# Patient Record
Sex: Female | Born: 1965 | Hispanic: Yes | Marital: Married | Smoking: Never smoker
Health system: Western US, Academic
[De-identification: ages and names within clinical notes are randomized; demographics above are authoritative.]

## PROBLEM LIST (undated history)

## (undated) ENCOUNTER — Ambulatory Visit (HOSPITAL_BASED_OUTPATIENT_CLINIC_OR_DEPARTMENT_OTHER): Admission: RE | Payer: Self-pay | Source: Ambulatory Visit | Admitting: Diagnostic Radiology

## (undated) DIAGNOSIS — E119 Type 2 diabetes mellitus without complications: Secondary | ICD-10-CM

## (undated) DIAGNOSIS — I10 Essential (primary) hypertension: Secondary | ICD-10-CM

## (undated) DIAGNOSIS — C220 Liver cell carcinoma: Secondary | ICD-10-CM

## (undated) DIAGNOSIS — K219 Gastro-esophageal reflux disease without esophagitis: Secondary | ICD-10-CM

## (undated) HISTORY — DX: Liver cell carcinoma (CMS-HCC): C22.0

## (undated) HISTORY — DX: Gastro-esophageal reflux disease without esophagitis: K21.9

## (undated) HISTORY — DX: Type 2 diabetes mellitus without complications (CMS-HCC): E11.9

## (undated) HISTORY — DX: Essential (primary) hypertension: I10

## (undated) SURGERY — IR EMBO TACE CHEMO
Anesthesia: Moderate Sedation - by non-anesthesia staff only

## (undated) MED ORDER — ONDANSETRON HCL 4 MG/2ML IV SOLN
16.00 mg | Freq: Once | INTRAMUSCULAR | Status: AC
Start: 2016-08-18 — End: 2016-08-18

## (undated) MED ORDER — STERILE WATER FOR INJECTION IJ SOLN
Freq: Once | INTRAMUSCULAR | Status: AC
Start: 2016-08-18 — End: ?

## (undated) MED ORDER — DIPHENHYDRAMINE HCL 50 MG/ML IJ SOLN
25.00 mg | Freq: Once | INTRAMUSCULAR | Status: AC
Start: 2016-08-18 — End: 2016-08-18

## (undated) MED ORDER — SODIUM CHLORIDE 0.9 % IV SOLN
INTRAVENOUS | Status: AC
Start: 2016-08-18 — End: ?

## (undated) MED ORDER — STERILE WATER FOR INJECTION IJ SOLN
1000.00 mg | Freq: Once | INTRAMUSCULAR | Status: AC
Start: 2016-08-18 — End: 2016-08-18

## (undated) MED ORDER — METRONIDAZOLE IN NACL 5-0.79 MG/ML-% IV SOLN
500.00 mg | Freq: Once | INTRAVENOUS | Status: AC
Start: 2016-08-18 — End: 2016-08-18

## (undated) MED ORDER — DOXORUBICIN HCL 50 MG IV SOLR
Freq: Once | INTRAVENOUS | Status: AC
Start: 2016-08-18 — End: ?

---

## 2013-06-15 HISTORY — PX: CHOLECYSTECTOMY: SHX55

## 2016-07-08 LAB — CBC WITH DIFF, BLOOD
Basophils: 0.4
Eosinophils: 0.5
Hct: 36.7
Hgb: 12
INR: 1
Lymphocytes: 18.9 — ABNORMAL LOW
MCH: 29
MCHC: 33
MCV: 89
Monocytes: 10
PT,Patient: 10.8
Plt Count: 429
RBC: 4.15
RDW: 16.1 — ABNORMAL HIGH
Segs: 70.2 — ABNORMAL HIGH
WBC: 11.5 — ABNORMAL HIGH

## 2016-07-08 LAB — COMPREHENSIVE METABOLIC PANEL, BLOOD
ALT (SGPT): 146 — ABNORMAL HIGH
AST (SGOT): 122 — ABNORMAL HIGH
Albumin: 3.7
Alkaline Phos: 556 — ABNORMAL HIGH
Anion Gap: 13
BUN: 19
Bicarbonate: 23
Bili Direct, Other: 6 — ABNORMAL HIGH
Bilirubin, Total: 6.8 — ABNORMAL HIGH
Calcium: 10.1
Chloride: 96
Creatinine: 0.4
Glucose: 217 — ABNORMAL HIGH
Lipase: 167
Potassium: 4.1
Sodium: 132 — ABNORMAL LOW
Total Protein: 8
eGFR If NonAfricn Am: >60

## 2016-07-08 LAB — HEPATITIS B SURFACE AG, BLOOD
Hep B Core Ab, IgM: NONREACTIVE
Hepatitis A Antibody IGM: NONREACTIVE
Hepatitis B Surface Ag: NONREACTIVE
Hepatitis C Ab: NONREACTIVE

## 2016-07-09 LAB — ALPHA FETOPROTEIN, BLOOD
AFP: 159 — ABNORMAL HIGH
Bili Direct, Other: 5.6 — ABNORMAL HIGH
CA 19-9: 7.9
CEA: <0.5
Glucose (POCT): 134 mg/dL — ABNORMAL HIGH (ref 70–115)
Glyco Hgb (A1C): 7.3 — ABNORMAL HIGH

## 2016-07-10 LAB — CBC WITH DIFF, BLOOD
Basophils: 0.4
Eosinophils: 1.1
Hct: 34.2 — ABNORMAL LOW
Hgb: 10.7 — ABNORMAL LOW
Lymphocytes: 18.2 — ABNORMAL LOW
MCH: 27
MCHC: 31
MCV: 88
Monocytes: 9.2
Plt Count: 436
RBC: 3.9 — ABNORMAL LOW
RDW: 16.3 — ABNORMAL HIGH
Segs: 71.1 — ABNORMAL HIGH
WBC: 9

## 2016-07-10 LAB — COMPREHENSIVE METABOLIC PANEL, BLOOD
ALT (SGPT): 106 — ABNORMAL HIGH
AST (SGOT): 66 — ABNORMAL HIGH
Albumin: 3.2 — ABNORMAL LOW
Alkaline Phos: 493 — ABNORMAL HIGH
Anion Gap: 7
BUN: 11
Bicarbonate: 28
Bilirubin, Total: 5.4 — ABNORMAL HIGH
Calcium: 9.4
Chloride: 101
Creatinine: 0.4
Glucose: 207 — ABNORMAL HIGH
Magnesium: 1.8
Phosphorous: 3.8
Potassium: 3.8
Sodium: 136 — ABNORMAL LOW
Total Protein: 7.1
eGFR If NonAfricn Am: >60

## 2016-07-22 ENCOUNTER — Telehealth (INDEPENDENT_AMBULATORY_CARE_PROVIDER_SITE_OTHER): Payer: Self-pay

## 2016-07-22 ENCOUNTER — Encounter (HOSPITAL_BASED_OUTPATIENT_CLINIC_OR_DEPARTMENT_OTHER): Payer: Self-pay

## 2016-07-22 DIAGNOSIS — R160 Hepatomegaly, not elsewhere classified: Principal | ICD-10-CM

## 2016-07-22 MED ORDER — METFORMIN HCL 1000 MG OR TABS: 1000.00 mg | ORAL_TABLET | Freq: Two times a day (BID) | ORAL | Status: AC

## 2016-07-22 MED ORDER — GLIMEPIRIDE 4 MG OR TABS
4.00 mg | ORAL_TABLET | Freq: Every morning | ORAL | Status: DC
Start: ? — End: 2016-10-08

## 2016-07-22 MED ORDER — LOSARTAN POTASSIUM 25 MG OR TABS: 25.00 mg | ORAL_TABLET | Freq: Every day | ORAL | Status: AC

## 2016-07-22 MED ORDER — PIOGLITAZONE HCL 30 MG OR TABS
30.00 mg | ORAL_TABLET | Freq: Every day | ORAL | Status: DC
Start: ? — End: 2016-10-08

## 2016-07-22 MED ORDER — OMEPRAZOLE 20 MG OR CPDR: 20.00 mg | DELAYED_RELEASE_CAPSULE | Freq: Every day | ORAL | Status: AC

## 2016-07-22 MED ORDER — CALCIUM CARBONATE-VITAMIN D 600-400 MG-UNIT OR TABS
1.00 | ORAL_TABLET | Freq: Two times a day (BID) | ORAL | Status: DC
Start: ? — End: 2016-08-12

## 2016-07-22 NOTE — Telephone Encounter (Signed)
New Patient records scan and authorization entered. Please review and advise as to when Pt can schedule appointment.   Thank You Physician Access [phone]  **Self Referral**

## 2016-07-22 NOTE — Telephone Encounter (Signed)
51 yo female with a sudden onset yellow eyes,skin, dk urine, itchiness, elevated LFTs, no alcohol, drug use; US Abd in ED [facility] 07/08/16 shows right lobe 10 cm liver mass, no biliary dilatation, normal pancreas.    CT Abd/Pelvis with findings of heterogeneous mass concerning for neoplasm- right liver lobe.    MRI/MRCP-inconclusive, suggesting fibrolamalellar  Hepatocellular carcinoma, versus adenoma, versus hyperplasia. No biliary obstruction.        Labs   07/08/16    ALK PHOS  556  AST   122  ALT   146  T BILI   6.8  D BILI   6.0  ALB   3.7  INR   1.0  PLTS   429  AFP   159  [place] 19-9  7.9  CEA   <0.5      MELD NA: 19    HAV/HBV/HCV neg.  No hx personal or family hx liver disease.  Hx Cholecystectomy 2015.  DM II on oral meds.  Family hx DM II.  No herbals or new medications.    Gastro consulted; followed with Hem/Onc, followed with Gen Surg for excision of mass.    Pt is self referring to [facility] for follow up and txmt.  Per notes, excellent family support.  Gastro provider- Dr. Valerie Norton, [facility].    Pt is self pay. No insurance at this time.    Guadalupe-  Please call pt to schedule her for 08/05/16 at 1000 to see Dr N. [name]. I placed hold on that slot.  Req she obtain and Fed Ex CD(s) of US Abd, CT Abd and MRI/MRCP overnight to : asap  [address]  [place] [address]  Att'n; VAL.    Fed Ex acc't: [id number].    Will be on alert for an earlier app't but this will offer a chance to obtain imaging and allow for review prior to app't.      Dr. [name]- please review and advise further.

## 2016-07-22 NOTE — Telephone Encounter (Signed)
There is no referral.  After carefully reviewing medical records, patient may benefit clinical visit with Hepatology.  Thank you

## 2016-07-22 NOTE — Telephone Encounter (Signed)
Pt is self referring. Please message below. Can you please review and advise. Thank you, PAS ext [phone]

## 2016-07-23 ENCOUNTER — Encounter (HOSPITAL_BASED_OUTPATIENT_CLINIC_OR_DEPARTMENT_OTHER): Payer: Self-pay

## 2016-07-23 ENCOUNTER — Telehealth (HOSPITAL_BASED_OUTPATIENT_CLINIC_OR_DEPARTMENT_OTHER): Payer: Self-pay

## 2016-07-23 DIAGNOSIS — E119 Type 2 diabetes mellitus without complications: Secondary | ICD-10-CM

## 2016-07-23 DIAGNOSIS — R17 Unspecified jaundice: Secondary | ICD-10-CM

## 2016-07-23 DIAGNOSIS — R772 Abnormality of alphafetoprotein: Secondary | ICD-10-CM

## 2016-07-23 DIAGNOSIS — R160 Hepatomegaly, not elsewhere classified: Secondary | ICD-10-CM

## 2016-07-23 NOTE — Telephone Encounter (Signed)
D/w Dr. Cameron Sprang- to see pt 07/29/16 at 1130 MOS.    Luis to call pt to reschedule her for 07/29/16 and req she bring CDs in ahead of app't date for uploading.  D/w Luis.

## 2016-07-23 NOTE — Telephone Encounter (Signed)
S/w pt and scheduled her for 08/05/16 with Dr. Cameron Sprang.    Pt has no insurance, made her aware of deposit ($175)

## 2016-07-23 NOTE — Telephone Encounter (Signed)
S/w Guadalupe. She s/w pt who accepted 08/05/16 app't with [name].  She will drop off CDs to [facility] clinic prior to app't.    Guadalupe unable to schedule pt in system.    S/w Luis, [employer]- req he schedule pt.

## 2016-07-23 NOTE — Telephone Encounter (Signed)
Ext lab results from 07/08/16 to 07/10/16 entered into EPIC.

## 2016-07-24 NOTE — Progress Notes (Deleted)
Surgical Oncology Consultation     Demographics:  Date: July 24, 2016   Patient Name: Laurie [name]   Medical Record #: [id number]   DOB: 1965/08/25  Age: 51 year old  Sex: female    MD Requesting Consultation:  Maricela Curet, [place], [place] [address]    Primary Care Physician:  Maricela Curet    Reason for Consultation:  Chief Complaint   Patient presents with    Surgery Consult        History of Present Illness:     Laurie [name] is a 51 year old female who presented to her local emergency department with a 5 day history of dark urine, yellow eyes and itchiness and was told by her PCP to come to the hospital for urgent evaluation of abnormal liver panel. Upon presentation she was noted to abnormal liver panle: tBili 6.8, alkaline phosphatase 556, AST 122, ALT 146.     07/09/16: MRI abdomen/MRCP        07/09/16: CT abdomen/pelvis      07/27/16: Patient presents in clinic today for surgical consultation regarding her liver mass. ROS below.       Past Medical History:  Past Medical History:   Diagnosis Date    DM II (diabetes mellitus, type II), controlled (CMS-HCC) 07/23/2016       Problem List:  Patient Active Problem List    Diagnosis Date Noted    Liver mass, right lobe 07/23/2016    DM II (diabetes mellitus, type II), controlled (CMS-HCC) 07/23/2016    Jaundice 07/23/2016    Elevated AFP 07/23/2016       Past Surgical History:  Past Surgical History:   Procedure Laterality Date    CESAREAN SECTION, LOW TRANSVERSE      times 3    CHOLECYSTECTOMY  2015       Allergies:  No Known Allergies    Medications:  Current Outpatient Prescriptions   Medication Sig    calcium-vitamin D (CALTRATE 600+D) 600-400 MG-UNIT tablet Take 1 tablet by mouth 2 times daily.    glimepiride (AMARYL) 4 MG tablet Take 4 mg by mouth every morning.    losartan (COZAAR) 25 MG tablet Take 25 mg by mouth daily.    metFORMIN (GLUCOPHAGE) 1000 MG tablet Take 1,000  mg by mouth 2 times daily (with meals).    omeprazole (PRILOSEC) 20 MG capsule Take 20 mg by mouth daily.    pioglitazone (ACTOS) 30 MG tablet Take 30 mg by mouth daily.     No current facility-administered medications for this visit.        Social History:  Social History     Social History    Marital status: Married     Spouse name: N/A    Number of children: N/A    Years of education: N/A     Social History Main Topics    Smoking status: Never Smoker    Smokeless tobacco: Never Used    Alcohol use No    Drug use: Not on file    Sexual activity: Not on file     Social Activities of Daily Living Present    Not on file     Social History Narrative    No narrative on file       Family History:  No family history on file.    Screening:  Mammogram:  Pap Smear:   Colonoscopy:  Vaccines:  Review of Systems:   GENERAL: Denies weight loss, weight gain, fevers, chills, night sweats, change of appetite, fatigue, weakness.  SKIN: Denies rashes, itching, color change.  EARS, EYES, NOSE & THROAT: Denies headache, vision changes, enlarged lymph nodes.   BREASTS: Denies pain, history of lumps, discharge.  RESPIRATORY: Denies cough, wheezing, sputum production, shortness of breath, dyspnea on exertion, emphysema, asthma.  CARDIAC:  Denies chest pain, palpitations, heart murmurs, arrhythmias, congestive heart failure.  GASTROINTESTINAL TRACT: Denies nausea, vomiting, diarrhea, constipation, abdominal pain, hematemesis, melena, bright red blood per rectum, incontinence, bloating, heartburn, hepatitis, jaundice, scleral icterus, acholic stools.  URINARY SYSTEM: Denies dysuria, frequency, urgency, trouble controlling urine, biliuria, hematuria, stones.  ENDOCRINE: Denies diabetes, thyroid problems.  MUSCULOSKELETAL:  Denies weakness, joint pains, arthritis.  NEUROLOGICAL: Denies numbness, weakness, tingling, fainting spells, seizures, mini-strokes.  HEMATOLOGICAL: Denies easy bruising, excessive bleeding, anemia, blood  transfusions.  LYMPHATIC: Denies lymphadenopathy.  PERIPHERAL VASCULAR: Denies leg pain when walking or exercising.  INFECTION: Denies antibiotic sure for dental procedures.  GYNECOLOGIC: Denies menstrual problems, difficult pregnancies, any chance of being pregnant.  PSYCHIATRIC: Denies anxiety, depression, insomnia, increased sleep.    Physical Examination:  There were no vitals taken for this visit.  GENERAL: Well developed/well nourished and pleasant/cooperative 51 year old female in no acute distress.  HEENT: Normocephalic/atraumatic. Sclerae anicteric with pink conjunctiva. Mucus membranes moist. Oropharynx clear.  NECK:  Supple.  No lymphadenopathy.  SUPRACLAVICULAR LYMPH NODES:  Non-palpable.   HEART: Normal rate and regular rhythm without murmurs, clicks, or gallops.  LUNGS: Clear to auscultation bilaterally without rales, rhonchi, or wheezes.  ABDOMEN:  Abdomen soft, non-tender, non-distended without palpable masses or hepatolienomegaly. No evidence of porto-systemic varices.  GROIN: No lymphadenopathy.  BACK: Non-tender to palpation.  EXTREMITIES:  Full range of motion. No clubbing, cyanosis, or edema.  SKIN:  Negative.  NEURO: Non-focal. Normal motor function and sensation to light touch.  PSYCHIATRIC: Alert and oriented to person, place and time.    Labs:     07/09/2016 07:38   AFP 159 (H)      07/08/2016 21:15   Hepatitis A Antibody IGM non reactive   Hepatitis C Ab non reactive   Hep B Core Ab, IgM non reactive   Hepatitis B Surface Ag non reactive      07/09/2016 07:38   [place] 19-9 7.9   CEA <0.5        07/10/2016 06:00   WBC 9.0   RBC 3.90 (L)   Hgb 10.7 (L)   Hct 34.2 (L)   MCV 88   MCH 27   MCHC 31   RDW 16.3 (H)   Plt Count 436   Lymphocytes 18.2 (L)   Basophils 0.4   Eosinophils 1.1   Monocytes 9.2   Segs 71.1 (H)      07/08/2016 21:15   PT,Patient 10.8   INR 1.0      07/10/2016 06:00   eGFR If NonAfricn Am >60   Glucose 207 (H)   BUN 11   Creatinine 0.4   Sodium 136 (L)   Potassium 3.8   Chloride 101      Bicarbonate 28   Calcium 9.4   AST (SGOT) 66 (H)   ALT (SGPT) 106 (H)   Bilirubin, Total 5.4 (H)   Alkaline Phos 493 (H)   Total Protein 7.1   Albumin 3.2 (L)       Studies (I personally reviewed the imaging and agree with below):  MRI abdomen: 07/09/16 ([facility])  As  noted above    CT-scan of abdomen and pelvis: 07/09/16 ([facility])  As noted above    Assessment/Plan:  In summary this patient is a 51 year old female with who presented to her local emergency department with a 5 day history of dark urine, yellow eyes and itchiness and was told by her PCP to come to the hospital for urgent evaluation of abnormal liver panel. Upon presentation she was noted to abnormal liver panle: tBili 6.8, alkaline phosphatase 556, AST 122, ALT 146. MRI of the abdomen 07/09/16 revealed a 11.3 x 8.0-cm mass in the right liver with radiologic feature consistent with hepatocellular carcinoma.    Her Hepatology serology was negative on 07/08/16 and her AFP is noted to be elevated to 154 07/09/16.    Today in clinic I reviewed her imaging and medical history. We discussed      At this time, I recommend:  1.  2.  3.  4.        Spent more than 35 of 45 minutes counseling patient, reviewing images and coordinating care.    Patient seen/examined.  All questions/concerns answered.    Jefferson Fuel. Sicklick, MD    Associate Professor of Surgery  Division of [place] [facility] [place] [place]     [address]  Mail [address]  [place], [place] [address]    Tel: [phone]  Admin Mauri [place]): [phone]; [email or web address]  Physician Assistant: Mirna Mires, PA-C: [phone]   Fax: [phone]  Pager: [phone]  Email: [email or web address]

## 2016-07-27 ENCOUNTER — Inpatient Hospital Stay (HOSPITAL_BASED_OUTPATIENT_CLINIC_OR_DEPARTMENT_OTHER): Payer: MEDICAID

## 2016-07-27 ENCOUNTER — Encounter (HOSPITAL_BASED_OUTPATIENT_CLINIC_OR_DEPARTMENT_OTHER): Payer: Self-pay | Admitting: General Surgery

## 2016-07-27 ENCOUNTER — Inpatient Hospital Stay
Admission: EM | Admit: 2016-07-27 | Discharge: 2016-07-28 | DRG: 435 | Disposition: A | Discharge status: Home / Self Care | Payer: MEDICAID | Attending: General Surgery | Admitting: General Surgery

## 2016-07-27 ENCOUNTER — Ambulatory Visit: Payer: MEDICAID | Attending: General Surgery | Admitting: General Surgery

## 2016-07-27 ENCOUNTER — Encounter (HOSPITAL_COMMUNITY)
Admission: EM | Disposition: A | Discharge status: Home Health | Payer: Self-pay | Source: Emergency Department | Attending: General Surgery

## 2016-07-27 VITALS — BP 140/74 | HR 92 | Temp 98.6°F | Resp 16 | Ht 59.0 in | Wt 202.4 lb

## 2016-07-27 DIAGNOSIS — E119 Type 2 diabetes mellitus without complications: Secondary | ICD-10-CM | POA: Diagnosis present

## 2016-07-27 DIAGNOSIS — K831 Obstruction of bile duct: Secondary | ICD-10-CM | POA: Diagnosis present

## 2016-07-27 DIAGNOSIS — R160 Hepatomegaly, not elsewhere classified: Secondary | ICD-10-CM | POA: Diagnosis present

## 2016-07-27 DIAGNOSIS — Z6841 Body Mass Index (BMI) 40.0 and over, adult: Secondary | ICD-10-CM

## 2016-07-27 DIAGNOSIS — C229 Malignant neoplasm of liver, not specified as primary or secondary: Principal | ICD-10-CM | POA: Insufficient documentation

## 2016-07-27 DIAGNOSIS — C220 Liver cell carcinoma: Principal | ICD-10-CM | POA: Diagnosis present

## 2016-07-27 DIAGNOSIS — R17 Unspecified jaundice: Secondary | ICD-10-CM

## 2016-07-27 DIAGNOSIS — Z9049 Acquired absence of other specified parts of digestive tract: Secondary | ICD-10-CM

## 2016-07-27 DIAGNOSIS — Z800 Family history of malignant neoplasm of digestive organs: Secondary | ICD-10-CM

## 2016-07-27 DIAGNOSIS — K219 Gastro-esophageal reflux disease without esophagitis: Secondary | ICD-10-CM | POA: Diagnosis present

## 2016-07-27 DIAGNOSIS — E669 Obesity, unspecified: Secondary | ICD-10-CM | POA: Diagnosis present

## 2016-07-27 DIAGNOSIS — Z794 Long term (current) use of insulin: Secondary | ICD-10-CM

## 2016-07-27 LAB — COMPREHENSIVE METABOLIC PANEL, BLOOD
ALT (SGPT): 58 U/L — ABNORMAL HIGH (ref 0–33)
AST (SGOT): 62 U/L — ABNORMAL HIGH (ref 0–32)
Albumin: 3.1 g/dL — ABNORMAL LOW (ref 3.5–5.2)
Alkaline Phos: 411 U/L — ABNORMAL HIGH (ref 35–140)
Anion Gap: 18 mmol/L — ABNORMAL HIGH (ref 7–15)
BUN: 9 mg/dL (ref 6–20)
Bicarbonate: 23 mmol/L (ref 22–29)
Bilirubin, Tot: 3.12 mg/dL — ABNORMAL HIGH (ref <1.2)
Calcium: 9.5 mg/dL (ref 8.5–10.6)
Chloride: 98 mmol/L (ref 98–107)
Creatinine: 0.44 mg/dL — ABNORMAL LOW (ref 0.51–0.95)
GFR: >60 mL/min
Glucose: 141 mg/dL — ABNORMAL HIGH (ref 70–99)
Potassium: 3.9 mmol/L (ref 3.5–5.1)
Sodium: 139 mmol/L (ref 136–145)
Total Protein: 8.2 g/dL — ABNORMAL HIGH (ref 6.0–8.0)

## 2016-07-27 LAB — GLUCOSE (POCT)
Glucose (POCT): 112 mg/dL — ABNORMAL HIGH (ref 70–99)
Glucose (POCT): 73 mg/dL (ref 70–99)
Glucose (POCT): 193 mg/dL — ABNORMAL HIGH (ref 70–99)

## 2016-07-27 LAB — APTT, BLOOD: PTT: 33 s (ref 25–34)

## 2016-07-27 LAB — CBC WITH DIFF, BLOOD
ANC-Automated: 7.1 10*3/uL — ABNORMAL HIGH (ref 1.6–7.0)
Abs Eosinophils: 0.1 10*3/uL (ref 0.1–0.5)
Abs Lymphs: 1.6 10*3/uL (ref 0.8–3.1)
Abs Monos: 0.7 10*3/uL (ref 0.2–0.8)
Eosinophils: 1 %
Hct: 35.5 % (ref 34.0–45.0)
Hgb: 11 gm/dL — ABNORMAL LOW (ref 11.2–15.7)
Imm Gran %: 1 % (ref <1)
Imm Gran Abs: 0.1 10*3/uL (ref <0.1)
Lymphocytes: 16 %
MCH: 27.6 pg (ref 26.0–32.0)
MCHC: 31 g/dL — ABNORMAL LOW (ref 32.0–36.0)
MCV: 89 um3 (ref 79.0–95.0)
Monocytes: 7 %
Plt Count: 549 10*3/uL — ABNORMAL HIGH (ref 140–370)
RBC: 3.99 10*6/uL (ref 3.90–5.20)
RDW: 19.4 % — ABNORMAL HIGH (ref 12.0–14.0)
Segs: 74 %
WBC: 9.5 10*3/uL (ref 4.0–10.0)

## 2016-07-27 LAB — PHOSPHORUS, BLOOD: Phosphorous: 3.2 mg/dL (ref 2.7–4.5)

## 2016-07-27 LAB — URINALYSIS WITH CULTURE REFLEX, WHEN INDICATED
Bilirubin: NEGATIVE
Blood: NEGATIVE
Glucose: NEGATIVE
Nitrite: NEGATIVE
Specific Gravity: 1.019 (ref 1.002–1.030)
pH: 6 (ref 5.0–8.0)

## 2016-07-27 LAB — TYPE & SCREEN
ABO/RH: A POS
Antibody Screen: NEGATIVE

## 2016-07-27 LAB — MAGNESIUM, BLOOD: Magnesium: 1.9 mg/dL (ref 1.6–2.6)

## 2016-07-27 LAB — HIV 1/2 ANTIBODY & P24 ANTIGEN ASSAY, BLOOD: HIV 1/2 Antibody & P24 Antigen Assay: NONREACTIVE

## 2016-07-27 LAB — ABO/RH CONFIRMATION: ABO/RH: A POS

## 2016-07-27 LAB — LIPASE, BLOOD: Lipase: 38 U/L (ref 13–60)

## 2016-07-27 LAB — BILIRUBIN, DIR BLOOD: Bilirubin, Dir: 1.8 mg/dL — ABNORMAL HIGH (ref <0.2)

## 2016-07-27 SURGERY — IR BIOPSY LIVER NON FOCAL
Anesthesia: Moderate Sedation - by non-anesthesia staff only | Site: Abdomen

## 2016-07-27 MED ORDER — MORPHINE SULFATE 4 MG/ML IJ SOLN
4.0000 mg | INTRAMUSCULAR | Status: DC | PRN
Start: 2016-07-27 — End: 2016-07-27

## 2016-07-27 MED ORDER — ACETAMINOPHEN 325 MG PO TABS
650.0000 mg | ORAL_TABLET | Freq: Four times a day (QID) | ORAL | Status: DC
Start: 2016-07-27 — End: 2016-07-27

## 2016-07-27 MED ORDER — IOHEXOL 350 MG/ML IV SOLN
75.00 mL | Freq: Once | INTRAVENOUS | Status: AC
Start: 2016-07-27 — End: 2016-07-27
  Administered 2016-07-27: 75 mL via INTRAVENOUS
  Filled 2016-07-27: qty 75

## 2016-07-27 MED ORDER — HYDROMORPHONE HCL 1 MG/ML IJ SOLN
INTRAMUSCULAR | Status: DC | PRN
Start: 2016-07-27 — End: 2016-07-27
  Administered 2016-07-27: 0.5 mg via INTRAVENOUS

## 2016-07-27 MED ORDER — SODIUM CHLORIDE 0.9% TKO INFUSION
INTRAVENOUS | Status: DC | PRN
Start: 2016-07-27 — End: 2016-07-28

## 2016-07-27 MED ORDER — ACETAMINOPHEN 325 MG PO TABS
650.0000 mg | ORAL_TABLET | Freq: Four times a day (QID) | ORAL | Status: DC | PRN
Start: 2016-07-27 — End: 2016-07-28

## 2016-07-27 MED ORDER — GLUCAGON HCL (RDNA) 1 MG IJ SOLR
1.0000 mg | Freq: Once | INTRAMUSCULAR | Status: DC | PRN
Start: 2016-07-27 — End: 2016-07-28

## 2016-07-27 MED ORDER — ACETAMINOPHEN 325 MG PO TABS
650.0000 mg | ORAL_TABLET | ORAL | Status: DC | PRN
Start: 2016-07-27 — End: 2016-07-27

## 2016-07-27 MED ORDER — SODIUM CHLORIDE 0.9 % IJ SOLN (CUSTOM)
3.0000 mL | Freq: Three times a day (TID) | INTRAMUSCULAR | Status: DC
Start: 2016-07-27 — End: 2016-07-28
  Administered 2016-07-27: 3 mL via INTRAVENOUS

## 2016-07-27 MED ORDER — MIDAZOLAM HCL 2 MG/2ML IJ SOLN
INTRAMUSCULAR | Status: DC | PRN
Start: 2016-07-27 — End: 2016-07-27
  Administered 2016-07-27: 0.5 mg via INTRAVENOUS
  Administered 2016-07-27: 1 mg via INTRAVENOUS
  Administered 2016-07-27: 0.5 mg via INTRAVENOUS

## 2016-07-27 MED ORDER — URSODIOL 250 MG OR TABS
250.0000 mg | ORAL_TABLET | Freq: Two times a day (BID) | ORAL | Status: DC
Start: 2016-07-27 — End: 2016-07-28
  Administered 2016-07-27 – 2016-07-28 (×2): 250 mg via ORAL
  Filled 2016-07-27 (×2): qty 1

## 2016-07-27 MED ORDER — INSULIN LISPRO (HUMAN) 100 UNIT/ML SC SOLN (CUSTOM)
1.0000 [IU] | Freq: Four times a day (QID) | INTRAMUSCULAR | Status: DC
Start: 2016-07-27 — End: 2016-07-28

## 2016-07-27 MED ORDER — LOSARTAN POTASSIUM 25 MG OR TABS
25.0000 mg | ORAL_TABLET | Freq: Every day | ORAL | Status: DC
Start: 2016-07-27 — End: 2016-07-28
  Administered 2016-07-27 – 2016-07-28 (×2): 25 mg via ORAL
  Filled 2016-07-27 (×2): qty 1

## 2016-07-27 MED ORDER — ONDANSETRON HCL 4 MG/2ML IV SOLN
4.0000 mg | Freq: Four times a day (QID) | INTRAMUSCULAR | Status: DC | PRN
Start: 2016-07-27 — End: 2016-07-28

## 2016-07-27 MED ORDER — SODIUM CHLORIDE 0.9 % IJ SOLN (CUSTOM)
3.0000 mL | INTRAMUSCULAR | Status: DC | PRN
Start: 2016-07-27 — End: 2016-07-28

## 2016-07-27 MED ORDER — SODIUM CHLORIDE 0.9 % IV SOLN
1000.00 mg | Freq: Once | INTRAVENOUS | Status: AC
Start: 2016-07-27 — End: 2016-07-27
  Administered 2016-07-27: 1000 mg via INTRAVENOUS
  Filled 2016-07-27: qty 1000

## 2016-07-27 MED ORDER — LANSOPRAZOLE 15 MG OR CPDR
15.0000 mg | DELAYED_RELEASE_CAPSULE | Freq: Every day | ORAL | Status: DC
Start: 2016-07-28 — End: 2016-07-28
  Administered 2016-07-28: 15 mg via ORAL
  Filled 2016-07-27: qty 1

## 2016-07-27 MED ORDER — FENTANYL CITRATE (PF) 100 MCG/2ML IJ SOLN
INTRAMUSCULAR | Status: DC | PRN
Start: 2016-07-27 — End: 2016-07-27
  Administered 2016-07-27: 50 ug via INTRAVENOUS
  Administered 2016-07-27 (×2): 25 ug via INTRAVENOUS

## 2016-07-27 MED ORDER — NALOXONE HCL 0.4 MG/ML IJ SOLN
0.1000 mg | INTRAMUSCULAR | Status: DC | PRN
Start: 2016-07-27 — End: 2016-07-28

## 2016-07-27 MED ORDER — OXYCODONE HCL 5 MG OR TABS
5.0000 mg | ORAL_TABLET | Freq: Four times a day (QID) | ORAL | Status: DC | PRN
Start: 2016-07-27 — End: 2016-07-28
  Administered 2016-07-27 – 2016-07-28 (×3): 5 mg via ORAL
  Filled 2016-07-27 (×4): qty 1

## 2016-07-27 MED ORDER — SODIUM CHLORIDE 0.9 % IV SOLN
INTRAVENOUS | Status: DC | PRN
Start: 2016-07-27 — End: 2016-07-27
  Administered 2016-07-27: 100 mL/h via INTRAVENOUS

## 2016-07-27 MED ORDER — LIDOCAINE HCL 1 % IJ SOLN
INTRAMUSCULAR | Status: DC | PRN
Start: 2016-07-27 — End: 2016-07-27
  Administered 2016-07-27: 10 mL via INTRADERMAL

## 2016-07-27 MED ORDER — GLUCOSE 4 GM PO CHEW (CUSTOM)
4.0000 | CHEWABLE_TABLET | ORAL | Status: DC | PRN
Start: 2016-07-27 — End: 2016-07-28

## 2016-07-27 MED ORDER — DEXTROSE (DIABETIC USE) 40 % OR GEL
1.0000 | ORAL | Status: DC | PRN
Start: 2016-07-27 — End: 2016-07-28

## 2016-07-27 MED ORDER — ONDANSETRON HCL 4 MG/2ML IV SOLN
4.0000 mg | Freq: Four times a day (QID) | INTRAMUSCULAR | Status: DC | PRN
Start: 2016-07-27 — End: 2016-07-27

## 2016-07-27 MED ORDER — DEXTROSE 50 % IV SOLN
12.5000 g | INTRAVENOUS | Status: DC | PRN
Start: 2016-07-27 — End: 2016-07-28

## 2016-07-27 MED ORDER — POTASSIUM CHLORIDE IN NACL 20-0.45 MEQ/L-% IV SOLN
INTRAVENOUS | Status: DC
Start: 2016-07-27 — End: 2016-07-27
  Administered 2016-07-27: 12:00:00 via INTRAVENOUS
  Filled 2016-07-27 (×2): qty 1000

## 2016-07-27 MED ORDER — SODIUM CHLORIDE 0.9 % IV SOLN
Freq: Once | INTRAVENOUS | Status: AC
Start: 2016-07-27 — End: 2016-07-27
  Administered 2016-07-27: 10:00:00 via INTRAVENOUS

## 2016-07-27 SURGICAL SUPPLY — 8 items
APPLICATOR CHLORAPREP 26ML, ~~LOC~~ (Misc Surgical Supply) IMPLANT
BIOPINCE COAXIAL NEEDLE 17G X 15CM (Needles/punch/cannula/biopsy) ×3 IMPLANT
CONTAINER PREFIL FORMALIN 60ML (Misc Medical Supply) ×3 IMPLANT
COVER PROBE MICROTEK INTRAOPERATIVE 5" X 96" PC1308 (Drapes/towels) ×3 IMPLANT
NEEDLE BIOPSY BIOPINCE 18G X 15CM (Needles/punch/cannula/biopsy) ×3
PREP CHLOROPREP 10.5ML 1-STEP (Misc Medical Supply) IMPLANT
PROCEDURE PACK - IR NON-VASCULAR (Procedure Packs/kits) ×3 IMPLANT
TOWELS OR BLUE 4-PACK STERILE, DISPOSABLE (Drapes/towels) ×3 IMPLANT

## 2016-07-27 NOTE — Progress Notes (Signed)
Surgical Oncology Consultation     Demographics:  Date: July 27, 2016   Patient Name: Laurie [name]   Medical Record #: [id number]   DOB: [birth year]  Age: 51 year old  Sex: female    MD Requesting Consultation:  Maricela Curet, [address] [place], [place] [address]    Primary Care Physician:  Maricela Curet    Reason for Consultation:  Chief Complaint   Patient presents with    Surgery Consult        History of Present Illness:     Laurie [name] is a 51 year old female who presented to her local emergency department with a 5 day history of dark urine, yellow eyes and itchiness, She was told by her primary care physician to come to the hospital for urgent evaluation of an abnormal liver panel during follow up for diabetes. Upon presentation she was noted to abnormal liver panel: total bili 6.8, alkaline phosphatase 556, AST 122, ALT 146.  She was hospitalized for two days.    07/09/16: MRI abdomen/MRCP            07/09/16: CT abdomen/pelvis      07/27/16: Patient presents in clinic today for surgical consultation regarding her liver mass. Review of systems as below.     Past Medical History:  Past Medical History:   Diagnosis Date    DM II (diabetes mellitus, type II), controlled (CMS-HCC) 07/23/2016    GERD (gastroesophageal reflux disease)      Problem List:  Patient Active Problem List    Diagnosis Date Noted    Liver mass, right lobe 07/23/2016    DM II (diabetes mellitus, type II), controlled (CMS-HCC) 07/23/2016    Jaundice 07/23/2016    Elevated AFP 07/23/2016     Past Surgical History:  Past Surgical History:   Procedure Laterality Date    CESAREAN SECTION, LOW TRANSVERSE      times 3    CHOLECYSTECTOMY  2015     Allergies:  No Known Allergies    Medications:  Current Outpatient Prescriptions   Medication Sig    calcium-vitamin D (CALTRATE 600+D) 600-400 MG-UNIT tablet Take 1 tablet by mouth 2 times daily.    glimepiride (AMARYL) 4 MG tablet  Take 4 mg by mouth every morning.    losartan (COZAAR) 25 MG tablet Take 25 mg by mouth daily.    metFORMIN (GLUCOPHAGE) 1000 MG tablet Take 1,000 mg by mouth 2 times daily (with meals).    omeprazole (PRILOSEC) 20 MG capsule Take 20 mg by mouth daily.    pioglitazone (ACTOS) 30 MG tablet Take 30 mg by mouth daily.     No current facility-administered medications for this visit.      Social History:  Social History     Social History    Marital status: Married     Spouse name: N/A    Number of children: N/A    Years of education: N/A     Social History Main Topics    Smoking status: Never Smoker    Smokeless tobacco: Never Used    Alcohol use No    Drug use: Not on file    Sexual activity: Not on file     Social Activities of Daily Living Present    Not on file     Social History Narrative     Family History:  No cancer    Screening:  Mammogram: 2017-normal  Pap Smear: 2017-normal  Colonoscopy: Never    Review of Systems:   GENERAL: Denies weight gain, fevers, chills, night sweats, change of appetite, fatigue, weakness. (+) weight loss - 10 lbs.  SKIN: Denies rashes, itching, color change. (+) pruritis.  EARS, EYES, NOSE & THROAT: Denies headache, vision changes, enlarged lymph nodes.   BREASTS: Denies pain, history of lumps, discharge.   RESPIRATORY: Denies cough, wheezing, sputum production, shortness of breath, dyspnea on exertion, emphysema, asthma.  CARDIAC:  Denies chest pain, palpitations, heart murmurs, arrhythmias, congestive heart failure.  GASTROINTESTINAL TRACT: Denies nausea, vomiting, diarrhea, constipation,  hematemesis, melena, bright red blood per rectum, incontinence, bloating, heartburn, hepatitis, (+) abdominal pain, jaundice, scleral icterus, acholic stools.  URINARY SYSTEM: Denies dysuria, frequency, urgency, trouble controlling urine, , hematuria, stones. (+) biliuria.  ENDOCRINE: Denies diabetes, thyroid problems. (+) diabetes.  MUSCULOSKELETAL:  Denies weakness, joint pains,  arthritis.  NEUROLOGICAL: Denies numbness, weakness, tingling, fainting spells, seizures, mini-strokes.  HEMATOLOGICAL: Denies easy bruising, excessive bleeding, anemia, blood transfusions.  LYMPHATIC: Denies lymphadenopathy.  GYNECOLOGIC: Denies menstrual problems, difficult pregnancies, any chance of being pregnant.  PSYCHIATRIC: Denies anxiety, depression, insomnia, increased sleep.    Physical Examination:  BP 140/74 (BP Location: Left arm, BP Patient Position: Sitting, BP cuff size: Large)   Pulse 92   Temp 98.6 F (37 C) (Oral)   Resp 16   Ht 4' 11"  (1.499 m)   Wt 91.8 kg (202 lb 7 oz)   SpO2 99%   BMI 40.89 kg/m2  GENERAL: Well developed/well nourished and pleasant/cooperative 51 year old female in no acute distress.  HEENT: Normocephalic/atraumatic. Sclerae anicteric with pink conjunctiva. Mucus membranes moist. Oropharynx clear.  NECK:  Supple.  No lymphadenopathy.  SUPRACLAVICULAR LYMPH NODES:  Non-palpable.   HEART: Normal rate and regular rhythm without murmurs, clicks, or gallops.  LUNGS: Clear to auscultation bilaterally without rales, rhonchi, or wheezes.  ABDOMEN:  Abdomen soft, non-tender, non-distended without palpable masses or hepatolienomegaly. No evidence of porto-systemic varices.  GROIN: No lymphadenopathy.  BACK: Non-tender to palpation.  EXTREMITIES:  Full range of motion. No clubbing, cyanosis, or edema.  SKIN:  Negative.  NEURO: Non-focal. Normal motor function and sensation to light touch.  PSYCHIATRIC: Alert and oriented to person, place and time.    Labs:     07/09/2016 07:38   AFP 159 (H)      07/08/2016 21:15   Hepatitis A Antibody IGM non reactive   Hepatitis C Ab non reactive   Hep B Core Ab, IgM non reactive   Hepatitis B Surface Ag non reactive      07/09/2016 07:38   [place] 19-9 7.9   CEA <0.5        07/10/2016 06:00   WBC 9.0   RBC 3.90 (L)   Hgb 10.7 (L)   Hct 34.2 (L)   MCV 88   MCH 27   MCHC 31   RDW 16.3 (H)   Plt Count 436   Lymphocytes 18.2 (L)   Basophils 0.4   Eosinophils 1.1     Monocytes 9.2   Segs 71.1 (H)      07/08/2016 21:15   PT,Patient 10.8   INR 1.0      07/10/2016 06:00   eGFR If NonAfricn Am >60   Glucose 207 (H)   BUN 11   Creatinine 0.4   Sodium 136 (L)   Potassium 3.8   Chloride 101   Bicarbonate 28   Calcium 9.4   AST (SGOT) 66 (H)  ALT (SGPT) 106 (H)   Bilirubin, Total 5.4 (H)   Alkaline Phos 493 (H)   Total Protein 7.1   Albumin 3.2 (L)     Studies (I personally reviewed the imaging and agree with below):  MRI abdomen: 07/09/16 ([facility])  As noted above    CT-scan of abdomen and pelvis: 07/09/16 ([facility])  As noted above    Assessment/Plan:  In summary, this patient is a 51 year old female who presented to her local emergency department with a 5 day history of dark urine, yellow eyes and itchiness, She was told by her primary care physician to come to the hospital for urgent evaluation of an abnormal liver panel. Upon presentation she was noted to abnormal liver panel: total bili 6.8, alkaline phosphatase 556, AST 122, ALT 146. MRI of the abdomen 07/09/16 revealed a 11.3 x 8.0-cm mass in the right liver with radiologic feature consistent with hepatocellular carcinoma ([place]).    Her Hepatology serology was negative on 07/08/16 and her AFP is noted to be elevated to 154 07/09/16.    Today in clinic I reviewed her imaging and medical history. We discussed    At this time I would recommend additional work up to including the following:     1. Will refer to ED for evalaution and admission. She needs biliary decompression for jaundice with pruritis. We will need IR consult for PTC/PTBD, as well as tumor biopsy.  2. CT Chest to evaluate for metastatic disease.  3. Labs: CBC, CMP, INR/PTT to evaluate liver function   4. Tumor markers: AFP total and L3%, as well as DCP.  5. HIV testing.  6. Digitize outside imaging for review.  7. Treatment options pending work-up and biliary decompression. Given the involvement of the left and right portal pedicles, as well as the right/middle  hepatic vein, I do not feel the tumor is resectable at this time.    Spent more than 35 of 45 minutes counseling patient, reviewing images and coordinating care.    Patient seen/examined.  All questions/concerns answered.    Jefferson Fuel. Taneah Masri, MD    Associate Professor of Surgery  Division of [place] [facility] [place] [place]     [address]  Mail [address]  [place], [place] [address]    Tel: [phone]  Admin Mauri [place]): [phone]; [email or web address]  Physician Assistant: Mirna Mires, PA-C: [phone]   Fax: [phone]  Pager: [phone]  Email: [email or web address]

## 2016-07-27 NOTE — ED Notes (Signed)
Second blood back tube drawn and sent to lab.

## 2016-07-27 NOTE — ED Notes (Signed)
Pt comes in today sent over from [facility].  Pt was found to have a mass on her liver a couple weeks ago after a visit to [facility].  Pt was referred here.  Pt went to her appointment today at [facility] and was told to come to ed for admission for biopsy and possible removal of mass.  Pt only c/o r sided abd pain and does not wish to have any pain meds.  Denies nausea.    Pt is a&Ox3, resp even and non labored.  Skins pink, warm, and dry.  No yellowing of skin or eyes noted today.  Pt is spanish speaking, her niece [name] is here to translate.  Pt refuses the use of martti and wishes to have her niece translate.   #20 piv placed in r ac, 1 attempt, blood drawn and sent to lab.  Urine sample also collected, upt done and urine sent to lab.

## 2016-07-27 NOTE — ED Provider Notes (Signed)
Emergency Department Note  [place] electronic medical record reviewed for pertinent medical history.     Nursing Triage Note:   Chief Complaint   Patient presents with    Abnormal Lab Results     pt presents to [facility] for surgical consult for liver mass. presented to her local emergency department with a 5 day history of dark urine, yellow eyes and itchiness and was told by her PCP to come to the hospital for urgent evaluation of abnormal liver panel. Upon presentation she was noted to abnormal liver panle: tBili 6.8, alkaline phosphatase 556, AST 122, ALT 146.    Abdominal Pain     RUQ abd pain x5       HPI:   Laurie [name] is a 51 year old female with a PMH significant for history of type 2 diabetes, GERD, recently diagnosed with a liver lesion was sent over here to the emergency department for admission from the [facility].  She was seeing her surgical oncologist for the 1st time and there was concern given her laboratory values, clinical symptoms that she may to be developing biliary obstruction from a liver mass.  The patient reports constant right upper quadrant liver but or abdominal pain.  The pain is nonradiating.  The pain is described as moderate intensity, throbbing.  She does not endorse a sharp or burning pain. Patient reports her urine also appears darker, she seems more yellow than usual.  She also feels very itchy.  The patient denies any chest pain, shortness of breath.  Patient denies any headache. Patient denies any visual changes.  Patient denies any dysuria, hematuria.  Patient denies any vaginal bleeding or discharge. Patient denies any hematochezia or melena.     has a past medical history of DM II (diabetes mellitus, type II), controlled (CMS-HCC) (07/23/2016) and GERD (gastroesophageal reflux disease).     has a past surgical history that includes Cholecystectomy (2015) and Cesarean section, low transverse.    Family History:  No family history on  file.    Social History:    Social History   Substance Use Topics    Smoking status: Never Smoker    Smokeless tobacco: Never Used    Alcohol use No       Medications:   No current facility-administered medications on file prior to encounter.      Current Outpatient Prescriptions on File Prior to Encounter   Medication Sig Dispense Refill    calcium-vitamin D (CALTRATE 600+D) 600-400 MG-UNIT tablet Take 1 tablet by mouth 2 times daily.      glimepiride (AMARYL) 4 MG tablet Take 4 mg by mouth every morning.      losartan (COZAAR) 25 MG tablet Take 25 mg by mouth daily.      metFORMIN (GLUCOPHAGE) 1000 MG tablet Take 1,000 mg by mouth 2 times daily (with meals).      omeprazole (PRILOSEC) 20 MG capsule Take 20 mg by mouth daily.      pioglitazone (ACTOS) 30 MG tablet Take 30 mg by mouth daily.       Allergies: Review of patient's allergies indicates no known allergies.    Review of Systems:   Review of Systems   Constitutional: Negative for fever.   HENT: Negative for sore throat.    Eyes: Negative for visual disturbance.   Respiratory: Negative for shortness of breath.    Cardiovascular: Negative for chest pain.   Gastrointestinal: Negative for abdominal pain.   Genitourinary:  Negative for dysuria.   Musculoskeletal: Negative for back pain.   Skin: Negative for rash.   Neurological: Negative for headaches.     All other systems reviewed and negative unless otherwise noted in the HPI or above. This was done per my custom and practice for systems appropriate to the chief complaint in an emergency department setting and varies depending on the quality of history that the patient is able to provide.      Physical Exam:  Vitals:    07/27/16 0939   BP: 156/89   Pulse: 103   Resp: 18   Temp: 98.3 F (36.8 C)   SpO2: 97%   Weight: 92.5 kg (204 lb)     Nursing note and vitals reviewed.     Physical Exam   Constitutional: She is oriented to person, place, and time. She appears well-developed and well-nourished. No  distress.   HENT:   Head: Normocephalic and atraumatic.   Eyes: EOM are normal. Pupils are equal, round, and reactive to light.   Neck: Normal range of motion. Neck supple.   Cardiovascular: Normal rate, regular rhythm and normal heart sounds.  Exam reveals no friction rub.    No murmur heard.  Pulmonary/Chest: Effort normal and breath sounds normal. No respiratory distress. She has no wheezes.   Abdominal: Soft. Bowel sounds are normal. She exhibits no distension (obese) and no mass. There is tenderness (ruq). There is no guarding.   Musculoskeletal: Normal range of motion.   Neurological: She is alert and oriented to person, place, and time. She exhibits normal muscle tone.   Skin: Skin is warm and dry. She is not diaphoretic. No erythema.   Psychiatric: She has a normal mood and affect. Her behavior is normal.   Nursing note and vitals reviewed.    Impression & Initial ED Plan:  51 year old female presents with right upper quadrant pain, liver mass.  Patient was sent by primary care physician for emergent an urgent evaluation by surgical oncologist given elevated liver enzymes concerning for possible biliary obstruction.  Surgical oncologist clinic sent patient over here for admission and possible IR placement of biliary stent.  Will repeat labs they requested including CBC, CBC CMP, direct bilirubin, INR, type and screen.  As part of oncologic workup, they requested a CT of chest which will be ordered for them to follow up on as an inpatient.  Differential includes biliary obstruction verses cancer related pain.  Low suspicion for pancreatitis at this time.  Low suspicion for cholecystitis given absence of infection symptoms.     The rest of the ED course, results, and plan for the patient is in a separate continuation note. Please see that note for details.     I have discussed my evaluation and care plan for the patient with the attending physician.    ED Course  Updated orders:   Orders Placed This Encounter    Procedures    CT Chest With Contrast    Comprehensive Metabolic Panel - See Instructions    CBC w/Auto Diff Lavender    Prothrombin Time, Blood Blue    Type & Screen Lavender    Magnesium, Blood Green Plasma Separator Tube    Phosphorus, Blood Green Plasma Separator Tube    Lipase, Blood Green Plasma Separator Tube    Urinalysis with Culture Reflex, when indicated    Bilirubin, Direct, Blood Green Plasma Separator Tube    HIV 1/2 Antibody & P24 Antigen Assay Yellow serum separator tube  Medications administered:  Medications   sodium chloride 0.9 % 1,000 mL IV bolus (not administered)   ondansetron (ZOFRAN) injection 4 mg (not administered)   morphine injection 4 mg (not administered)     Labs and imaging reviewed.  Pending.    ED Course:    07/27/16  0939   BP: 156/89   Pulse: 103   Resp: 18   Temp: 98.3 F (36.8 C)   SpO2: 97%     Mild tachycardia, otherwise unremarkable.    Spoke with Surgical Oncology service, they will admit the patient follow up on labs and imaging.  They will speak with IR them cells for possible biliary stent.    Disposition and/or plan:   Jacee Passala[place] will be admitted. Further work-up pending inpatient or observation care.    Discussed with attending provider.     Gloris Manchester, MD  Emergency Medicine       Corbett-Detig, Kalman Jewels, MD  Resident  07/27/16 [place], Clint Lipps, MD  07/27/16 [phone]

## 2016-07-27 NOTE — Consults (Signed)
Vascular and Interventional Radiology Consultation Note    Date: July 27, 2016   Patient Name: Laurie [name]   Medical Record #: [id number]   DOB: 04/20/1966, Age: 51 year old  Sex: female    Requesting MD: Kate Sable, [place] Ar    History of Present Illness:     Laurie [name] is a 51 year old female admitted from surgical oncology clinic with a large liver mass, elevated LFTs, itching, and jaundice. VIR consulted for liver mass biopsy and biliary drainage.    Past Medical History:   Diagnosis Date    DM II (diabetes mellitus, type II), controlled (CMS-HCC) 07/23/2016    GERD (gastroesophageal reflux disease)        ALLERGIES:No Known Allergies  Current Facility-Administered Medications   Medication    acetaminophen (TYLENOL) tablet 650 mg    dextrose 50 % solution 12.5 g    glucagon (GLUCAGON) injection 1 mg    glucose chewable tablet 16 g    glucose oral gel 1 Tube    insulin lispro (HUMALOG) injection 1-10 Units    [START ON 07/28/2016] lansoprazole (PREVACID) DR capsule 15 mg    losartan (COZAAR) tablet 25 mg    nalOXone (NARCAN) injection 0.1 mg    ondansetron (ZOFRAN) injection 4 mg    oxyCODONE (ROXICODONE) tablet 5 mg    sodium chloride (PF) 0.9 % flush 3 mL    sodium chloride (PF) 0.9 % flush 3 mL    sodium chloride 0.9 % TKO infusion    ursodiol (ACTIGALL) tablet 250 mg       Physical Exam  BP 120/54 (BP Location: Left arm, BP Patient Position: Semi-Fowlers)   Pulse 101   Temp 98.5 F (36.9 C)   Resp 26   Wt 92.5 kg (204 lb)   SpO2 97%   BMI 41.2 kg/m2  AOx3, overweight  RUQ skin site c/d/i  No skin lesions    Laboratory data:   Lab Results   Component Value Date    INR 1.0 07/08/2016    PTT 33 07/27/2016     Lab Results   Component Value Date    WBC 9.5 07/27/2016    HGB 11.0 07/27/2016    HCT 35.5 07/27/2016    PLT 549 07/27/2016    LYMPHS 16 07/27/2016     Lab Results   Component Value Date    NA 139 07/27/2016    K 3.9 07/27/2016    CL 98 07/27/2016    BICARB 23  07/27/2016    BUN 9 07/27/2016    CREAT 0.44 07/27/2016    GLU 141 07/27/2016    [place] 9.5 07/27/2016       Assessment and Plan:  51 year old female with right sided liver mass and jaundice.   -- plan for liver mass biopsy and biliary drain placement in IR as an add-on case    Thank you for the consult; please page VIR with any questions

## 2016-07-27 NOTE — Interdisciplinary (Signed)
07/27/16 1726   Assessment   Assessment Type Initial   Attended Daily Huddle? Yes   Patient Information   Why is Patient in the Hospital? Liver Mass   Prior to Level of Function Ambulatory/Independent with ADL's   Primary Contact Name [name] ( niece )   Discharge Planning   Living Arrangements Spouse / significant other;Family members   Support Systems Family member(s);Spouse / significant other   Type of Residence Private residence   Anticipated Supplies/Services  Too soon to be determined   Barriers to Discharge Awaiting clinical improvement   Designer, industrial/product Engaged in Discharge Planning Yes   Patient Has Been Given Options And Choice In The Selection of Post-Acute Care Providers Yes  (permission to contact contracted agencies with home needs prn. )   Readmission Risk Assessment   Readmission Within 30 Days of Discharge Yes   Admission Was Unplanned   Met with pt in room. Lives in [place] in a 1 story home with husband and daughter.  I-ADL's NO DME. No previous SNF or HH. Pharmacy is Copy in [place]. Family can transport pt back home upon d/c.

## 2016-07-27 NOTE — H&P (Signed)
HISTORY AND PHYSICAL    Attending MD:   Sicklick    Chief Complaint:  Jaundice, liver mass      History of Present Illness:     Laurie [name] is a 51 year old female who is here for evaluation of liver mass. Noticed yellow eyes, itching, and found to have elevated LFTs at primary care appointment 2 weeks ago. No abdominal pain. Eating normally. No acholic stools.     Past Medical and Surgical History:  Patient Active Problem List   Diagnosis    Liver mass, right lobe    DM II (diabetes mellitus, type II), controlled (CMS-HCC)    Jaundice    Elevated AFP     Past Medical History:   Diagnosis Date    DM II (diabetes mellitus, type II), controlled (CMS-HCC) 07/23/2016    GERD (gastroesophageal reflux disease)      Past Surgical History:   Procedure Laterality Date    CESAREAN SECTION, LOW TRANSVERSE      times 3    CHOLECYSTECTOMY  2015       Allergies:  No Known Allergies    Medications:  No current facility-administered medications on file prior to encounter.      Current Outpatient Prescriptions on File Prior to Encounter   Medication Sig Dispense Refill    calcium-vitamin D (CALTRATE 600+D) 600-400 MG-UNIT tablet Take 1 tablet by mouth 2 times daily.      glimepiride (AMARYL) 4 MG tablet Take 4 mg by mouth every morning.      losartan (COZAAR) 25 MG tablet Take 25 mg by mouth daily.      metFORMIN (GLUCOPHAGE) 1000 MG tablet Take 1,000 mg by mouth 2 times daily (with meals).      omeprazole (PRILOSEC) 20 MG capsule Take 20 mg by mouth daily.      pioglitazone (ACTOS) 30 MG tablet Take 30 mg by mouth daily.         Current Facility-Administered Medications   Medication Dose Route Frequency Provider Last Rate Last Dose    morphine injection 4 mg  4 mg IntraVENOUS Q3H PRN Corbett-Detig, Kalman Jewels, MD        ondansetron [facility]) injection 4 mg  4 mg IntraVENOUS Q6H PRN Corbett-Detig, Kalman Jewels, MD        sodium chloride 0.9 % 1,000 mL IV bolus   IntraVENOUS Once Corbett-Detig, Kalman Jewels, MD         Current Outpatient Prescriptions   Medication Sig Dispense Refill    calcium-vitamin D (CALTRATE 600+D) 600-400 MG-UNIT tablet Take 1 tablet by mouth 2 times daily.      glimepiride (AMARYL) 4 MG tablet Take 4 mg by mouth every morning.      losartan (COZAAR) 25 MG tablet Take 25 mg by mouth daily.      metFORMIN (GLUCOPHAGE) 1000 MG tablet Take 1,000 mg by mouth 2 times daily (with meals).      omeprazole (PRILOSEC) 20 MG capsule Take 20 mg by mouth daily.      pioglitazone (ACTOS) 30 MG tablet Take 30 mg by mouth daily.         Social History:  Social History   Substance Use Topics    Smoking status: Never Smoker    Smokeless tobacco: Never Used    Alcohol use No       Family History:  No family history on file.    Review of Systems:  Gen: No recent changes in weight.  No fever/chills.  HEENT: No changes in vision, rhinnitis, or changes in hearing. Positive scleral icterus  Resp: No SOB or cough.  CV: No CP or palpitations. No LE edema.  Abdomen: no pain, no nausea/vomiting. No constipation, diarrhea or changes in appetite. Denies BRBPR or melena.  Musc: No joint pain  Neuro: No focal numbness or tingling in upper or lower extremities. No changes in strength    Physical Exam:  Temperature:  [98.3 F (36.8 C)] 98.3 F (36.8 C) (02/12 0939)  Blood pressure (BP): (156)/(89) 156/89 (02/12 0939)  Heart Rate:  [103] 103 (02/12 0939)  Respirations:  [18] 18 (02/12 0939)  Pain Score: 7 (02/12 0939)  O2 Device: None (Room air) (02/12 0939)  SpO2:  [97 %] 97 % (02/12 0939)  Gen: healthy, alert, no distress, pleasant affect, cooperative.  HEENT: PERRL, EOMI  Neck: Supple, no LAD  CV:  RRR  Resp: CTAB, no chest deformities noted.  Abdomen: Abdomen soft, non-tender, non-distended.  Obese. Laparoscopic cholecystectomy scars. Low midline and pfannenstiel incisions  Extremities:  no cyanosis, clubbing, or edema and distal pulses normal.  Neuro: Gait normal. Sensation and strength grossly  normal.    Labs and Other Data:  Pending    Micro:  none    Imaging:  Reviewed outside imaging.     Assessment and Care Plan:  51 year old female with DM with new jaundice and large right sided liver mass with elevated AFP. Plan for PTC for biliary drainage and biopsy.    - admit to med/surg surg onc  - NPO until IR drainage  - CT chest to complete staging  - sliding scale insulin    Discussed with Dr. Candis Shine, MD  PGY5  Surgery

## 2016-07-27 NOTE — RN OR/Procedure Note (Signed)
Pt in IR for biliary drain and liver bx. Rocephan 1gm goven pre drain. Pt scanned with u/s by Dr Artemio Aly and bile ducts not dilated therefore only the Live bx was completed.

## 2016-07-27 NOTE — ED Floor Report (Signed)
ED to IP Handoff    Report created by Clemencia Course, RN at 10:51 AM 07/27/2016.     HANDOFF REPORT UPDATE/CHANGES (changes in patient status/care/events prior to transfer)  By who:  Time:   Additional information:                                                                                                                                                     Laurie [name] is a 51 year old female.    Brief Summary of ED Visit (to include focused assessment and neuro status):  Pt comes in today after being seen by dr. Donald Pore at [facility] for a liver mass.  Pt was referred her for workup of liver mass.  After being seen she was sent her for admission, they plan to do a biopsy and possible removal.  Pt is a&ox3, resp even and non labored.  Skins pink, warm, and dry.  Maex4,  Is spanish speaking.     RN shift assessment exceptions to WDL: none    Any significant events and interventions with responses:  none    Radiologic studies not completed: ct scan  (None unless otherwise noted)    Chief Complaint   Patient presents with    Abnormal Lab Results     pt presents to [facility] for surgical consult for liver mass. presented to her local emergency department with a 5 day history of dark urine, yellow eyes and itchiness and was told by her PCP to come to the hospital for urgent evaluation of abnormal liver panel. Upon presentation she was noted to abnormal liver panle: tBili 6.8, alkaline phosphatase 556, AST 122, ALT 146.    Abdominal Pain     RUQ abd pain x5       Admitted for: liver mass    Code Status:  Please refer to In-pt admitting doctors orders     Level of Care: m/s    Is patient septic? no If yes, complete below:    BC x 2 drawn? no  If No explain:      Repeat lactate needed? no  If Yes, when is it due?      All initial antibiotics given?  no  If No, explain:      Amount of IV fluids received 1000 ml    Is patient on Heparin? no If yes, complete below:     Time Heparin bolus was given:        Additional drips patient is on: ns    Cardiac rhythm:     Oxygen Delivery: None    Past Medical History:   Diagnosis Date    DM II (diabetes mellitus, type II), controlled (CMS-HCC) 07/23/2016    GERD (gastroesophageal reflux disease)        Past Surgical History:  Procedure Laterality Date    CESAREAN SECTION, LOW TRANSVERSE      times 3    CHOLECYSTECTOMY  2015       Allergies: Review of patient's allergies indicates no known allergies.    ED Fall Risk: No    Skin issues:  no    >> If yes, note areas of skin breakdown. See appropriate photos.      Ambulatory:  yes    Sitter needed: no    Suicide Risk:  no    Isolation Required: no     >> If yes , what type of isolation:     Is patient in custody?  no    Is patient in restraints? no    Vitals:    07/27/16 0939   BP: 156/89   Pulse: 103   Resp: 18   Temp: 98.3 F (36.8 C)   SpO2: 97%   Weight: 92.5 kg (204 lb)            Lab Results   Component Value Date    WBC 9.5 07/27/2016    RBC 3.99 07/27/2016    HGB 11.0 (L) 07/27/2016    HCT 35.5 07/27/2016    MCV 89.0 07/27/2016    MCHC 31.0 (L) 07/27/2016    RDW 19.4 (H) 07/27/2016    PLT 549 (H) 07/27/2016       No results found for: NA, K, CL, BICARB, BUN, CREAT, GLU, [place]    No results found for: BNP, PHOS, MG, LACTATE, AMMONIA, IONCA, ARTIONCA    No results found for: CPK, CKMBH, TROPONIN    No results found for: PH, PCO2, O2CONTENT, IVHC3, IVBE, O2SAT, UNPH, UNPCO2, ARTPH, ARTPCO2, ARTO2CNT, IAHC3, IABE, ARTO2SAT, UNAPH, UNAPCO2    No results found for this visit on 07/27/16.      Patient Lines/Drains/Airways Status    Active PICC Line / CVC Line / PIV Line / Drain / Airway / Intraosseous Line / Epidural Line / ART Line / Line Type / Wound     Name: Placement date: Placement time: Site: Days:    Peripheral IV - 20 G Right Antecubital 07/27/16   1013   Antecubital   less than 1                    ED Handoff Report is ready for review.  Admitting RN may reach Emergency Department RN, Clemencia Course, RN, at [phone]  with any questions.

## 2016-07-27 NOTE — ED Notes (Signed)
Spoke with courtney on 26f.  Report given.  Pt will be going to ct and then taken upstairs by ed tech r.solis.

## 2016-07-27 NOTE — Brief Op Note (Signed)
Preprocedure diagnosis:  Right sided liver mass, jaundice    Postprocedure diagnosis:  Right sided liver mass, jaundice    Procedure:  US guided liver mass biopsy    Operators:  A Ashlyne Olenick, MD    Medications:  Monitored conscious sedation    Findings:  Large right hepatic lobe mass  Minimal biliary ductal dilation     Specimens:  Liver mass core samples    Complications:  None    Disposition:  Stable to ward  Monitor for RUQ pain  Consider attempted biliary drainage if bilirubin continues to rise  No clear target/dilated ducts visualized at this time to explain obstructive jaundice

## 2016-07-27 NOTE — Patient Instructions (Signed)
Surgical Oncology Visit with Dr. Gwenlyn Saran    Date: July 27, 2016   Location: [facility]    Patient Name: Laurie Record #: [id number]   DOB: 1965/11/10  Age: 51 year old  Sex: female    Service: Surgical Oncology  Attending Provider: Gwenlyn Saran, MD    Assessment and Current Plan:    In summary, this patient is a 51 year old female who presented to her local emergency department with a 5 day history of dark urine, yellow eyes and itchiness, She was told by her primary care physician to come to the hospital for urgent evaluation of an abnormal liver panel. Upon presentation she was noted to abnormal liver panel: total bili 6.8, alkaline phosphatase 556, AST 122, ALT 146. 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    Her Hepatology serology was negative on 07/08/16 and her AFP is noted to be elevated to 154 07/09/16.    Today in clinic I reviewed her imaging and medical history. We discussed    At this time I would recommend additional work up to including the following:     1. Will refer to ED for evalaution and admission. She needs biliary decompression for jaundice with pruritis. We will need IR consult for PTC/PTBD, as well as tumor biopsy.  2. CT Chest to evaluate for metastatic disease.  3. Labs: CBC, CMP, INR/PTT to evaluate liver function   4. Tumor markers: AFP total and L3%, as well as DCP.  5. HIV testing.  6. Digitize outside imaging for review.  7. Treatment options pending work-up and biliary decompression. Given the involvement of the left and right portal pedicles, as well as the right/middle hepatic vein, I do not feel the tumor is resectable at this time.      Authorization for Studies or Procedures:  If needed, our authorization department will call your insurance company to get authorization for any procedure(s). This may take 1-5 working  days. Then Leotis Pain  from our office will contact you regarding the date of procedure, any preoperative anesthesia appointment and/or any preparation for the procedure. If you have not heard from [place] within 3-5 days, please feel free to contact her at [phone].      General Telephone Numbers for Scheduling Radiology Studies:  Call radiology to schedule your X-rays, CT scan, or MRI at [phone].   Call nuclear medicine to schedule your PET/ CT scan at [phone].     Questions/Concerns:  Daytime/Weekdays: Should any questions or concerns arise, please contact Dr. Donald Pore at [phone] or Mr. Sherrine Maples at [phone].    Evening/Weekends: If you are having concerns or problems after hours, you may call the hospital operator at [phone] and ask for the Surgical Oncology resident on call. He or she will be in communication with Dr. Donald Pore.    Emergencies: Call 911 or go to your closest emergency department and ask them to contact Dr. Donald Pore at [phone].      Jefferson Fuel. Alva Broxson, MD    Associate Professor of Surgery  Division of [place] [facility] [place], [place]     [address]  Mail Code [address]  [place], [place] [address]    Physician Assistant Mirna Mires): [phone]  Email: [email or web address]    Admin (  Mauri [place]): [phone]  Fax: [phone]

## 2016-07-28 DIAGNOSIS — R160 Hepatomegaly, not elsewhere classified: Secondary | ICD-10-CM

## 2016-07-28 LAB — MRSA SURVEILLANCE CULTURE

## 2016-07-28 LAB — CBC WITH DIFF, BLOOD
ANC-Automated: 7.2 10*3/uL — ABNORMAL HIGH (ref 1.6–7.0)
Abs Eosinophils: 0.1 10*3/uL (ref 0.1–0.5)
Abs Lymphs: 1.4 10*3/uL (ref 0.8–3.1)
Abs Monos: 0.4 10*3/uL (ref 0.2–0.8)
Eosinophils: 1 %
Hct: 31.1 % — ABNORMAL LOW (ref 34.0–45.0)
Hgb: 9.7 gm/dL — ABNORMAL LOW (ref 11.2–15.7)
Imm Gran %: 1 % (ref <1)
Imm Gran Abs: 0.1 10*3/uL (ref <0.1)
Lymphocytes: 15 %
MCH: 27.6 pg (ref 26.0–32.0)
MCHC: 31.2 g/dL — ABNORMAL LOW (ref 32.0–36.0)
MCV: 88.6 um3 (ref 79.0–95.0)
MPV: 9.5 fL (ref 9.4–12.4)
Monocytes: 5 %
Plt Count: 488 10*3/uL — ABNORMAL HIGH (ref 140–370)
RBC: 3.51 10*6/uL — ABNORMAL LOW (ref 3.90–5.20)
RDW: 19.5 % — ABNORMAL HIGH (ref 12.0–14.0)
Segs: 78 %
WBC: 9.3 10*3/uL (ref 4.0–10.0)

## 2016-07-28 LAB — PROTHROMBIN TIME, BLOOD
INR: 1.4
PT,Patient: 14.4 s — ABNORMAL HIGH (ref 9.7–12.5)

## 2016-07-28 LAB — LIVER PANEL, BLOOD
ALT (SGPT): 42 U/L — ABNORMAL HIGH (ref 0–33)
AST (SGOT): 38 U/L — ABNORMAL HIGH (ref 0–32)
Albumin: 2.5 g/dL — ABNORMAL LOW (ref 3.5–5.2)
Alkaline Phos: 317 U/L — ABNORMAL HIGH (ref 35–140)
Bilirubin, Dir: 1.3 mg/dL — ABNORMAL HIGH (ref <0.2)
Bilirubin, Tot: 2.11 mg/dL — ABNORMAL HIGH (ref <1.2)
Total Protein: 6.1 g/dL (ref 6.0–8.0)

## 2016-07-28 LAB — GLUCOSE (POCT)
Glucose (POCT): 124 mg/dL — ABNORMAL HIGH (ref 70–99)
Glucose (POCT): 130 mg/dL — ABNORMAL HIGH (ref 70–99)

## 2016-07-28 LAB — MAGNESIUM, BLOOD: Magnesium: 1.9 mg/dL (ref 1.6–2.6)

## 2016-07-28 LAB — BASIC METABOLIC PANEL, BLOOD
Anion Gap: 14 mmol/L (ref 7–15)
BUN: 8 mg/dL (ref 6–20)
Bicarbonate: 21 mmol/L — ABNORMAL LOW (ref 22–29)
Calcium: 8.7 mg/dL (ref 8.5–10.6)
Chloride: 100 mmol/L (ref 98–107)
Creatinine: 0.42 mg/dL — ABNORMAL LOW (ref 0.51–0.95)
GFR: >60 mL/min
Glucose: 160 mg/dL — ABNORMAL HIGH (ref 70–99)
Potassium: 4.3 mmol/L (ref 3.5–5.1)
Sodium: 135 mmol/L — ABNORMAL LOW (ref 136–145)

## 2016-07-28 LAB — APTT, BLOOD: PTT: 34 s (ref 25–34)

## 2016-07-28 LAB — PHOSPHORUS, BLOOD: Phosphorous: 3 mg/dL (ref 2.7–4.5)

## 2016-07-28 MED ORDER — ONDANSETRON HCL 8 MG OR TABS
8.00 mg | ORAL_TABLET | Freq: Three times a day (TID) | ORAL | 0 refills | Status: AC | PRN
Start: 2016-07-28 — End: ?

## 2016-07-28 MED ORDER — POTASSIUM CHLORIDE IN NACL 20-0.45 MEQ/L-% IV SOLN
INTRAVENOUS | Status: DC
Start: 2016-07-28 — End: 2016-07-28
  Administered 2016-07-28: 02:00:00 via INTRAVENOUS
  Filled 2016-07-28 (×3): qty 1000

## 2016-07-28 MED ORDER — ACETAMINOPHEN 325 MG PO TABS
650.00 mg | ORAL_TABLET | Freq: Four times a day (QID) | ORAL | 0 refills | Status: AC | PRN
Start: 2016-07-28 — End: ?

## 2016-07-28 MED ORDER — INFLUENZA VAC SPLIT QUAD 0.5 ML IM SUSY
0.5000 mL | PREFILLED_SYRINGE | INTRAMUSCULAR | Status: DC
Start: 2016-07-28 — End: 2016-07-28

## 2016-07-28 MED ORDER — MAGNESIUM SULFATE 2 GM/50ML IV SOLN
2.00 g | Freq: Once | INTRAVENOUS | Status: AC
Start: 2016-07-28 — End: 2016-07-28
  Administered 2016-07-28: 2 g via INTRAVENOUS
  Filled 2016-07-28: qty 50

## 2016-07-28 MED ORDER — OXYCODONE HCL 5 MG OR TABS
5.0000 mg | ORAL_TABLET | ORAL | 0 refills | Status: DC | PRN
Start: 2016-07-28 — End: 2016-09-15

## 2016-07-28 MED ORDER — DOCUSATE SODIUM 250 MG OR CAPS
250.0000 mg | ORAL_CAPSULE | Freq: Two times a day (BID) | ORAL | 0 refills | Status: DC | PRN
Start: 2016-07-28 — End: 2016-08-12

## 2016-07-28 MED ORDER — OXYCODONE HCL 10 MG OR TABS
10.0000 mg | ORAL_TABLET | ORAL | Status: DC | PRN
Start: 2016-07-28 — End: 2016-07-28
  Administered 2016-07-28: 10 mg via ORAL
  Filled 2016-07-28: qty 1

## 2016-07-28 NOTE — Interdisciplinary (Signed)
Patient discharged home via wheelchair escorted by transport team. Peripheral IV removed, flu shot declined, and discharge medications delivered to patients room. Went over discharge instructions with patient and daughter at bedside. No further questions at this time. Patient and family aware of the numbers to call if questions arise while at home

## 2016-07-28 NOTE — Progress Notes (Signed)
Surgery Progress Note    Patient Name: Laurie [name] MRN: [id number]    Hospital Day:   1 day - Admitted on: 07/27/2016  Post Operative Day(s): n/a        Events/Subjective:  IR biopsy of liver mass yesterday. Sore at that area. Had diet last night. No nausea.    Vitals signs:  Latest entry:   Temperature: 98.8 F (37.1 C)  Heart Rate: 97  Respirations: 19  Blood pressure (BP): 121/66  SpO2: 98 %  Weight: 92.5 kg (204 lb)  Percentage Weight Change (%): 0 %    Temperature:  [98.3 F (36.8 C)-98.8 F (37.1 C)] 98.8 F (37.1 C) (02/13 0744)  Blood pressure (BP): (108-156)/(47-89) 121/66 (02/13 0744)  Heart Rate:  [84-114] 97 (02/13 0744)  Respirations:  [18-28] 19 (02/13 0744)  Pain Score: 6 (02/13 0744)  O2 Device: None (Room air) (02/13 0744)  O2 Flow Rate (L/min):  [2 l/min] 2 l/min (02/12 1600)  SpO2:  [96 %-100 %] 98 % (02/13 0744)    Intake/Output       07/27/16 0600 - 07/28/16 0559 07/28/16 0600 - 07/29/16 0559      [id number] 1800-0559 Total 1308-6578 1800-0559 Total       Intake    P.O.  --  240 240  --  -- --    I.V.  --  312.5 312.5  --  -- --    Total Intake -- 552.5 552.5 -- -- --       Output    Urine  600  1000 1600  600  -- 600    Total Output 600 1000 1600 600 -- 600       Net I/O     -600 -447.5 -1047.5 -600 -- -600          Patient Lines/Drains/Airways Status    Active PICC Line / CVC Line / PIV Line / Drain / Airway / Intraosseous Line / Epidural Line / ART Line / Line Type / Wound     Name: Placement date: Placement time: Site: Days:    Peripheral IV - 20 G Right Antecubital 07/27/16   1013   Antecubital   less than 1                Diet Diet NPO Are oral medications allowed while the patient is NPO? Yes; Reason for NPO status: Patient NPO for procedure; Please specify procedure: possible GI    No Data Recorded     Labs:  CBC  Recent Labs      07/27/16   1001  07/28/16   0527   WBC  9.5  9.3   HGB  11.0*  9.7*   HCT  35.5  31.1*   PLT  549*  488*   SEG  74  78   LYMPHS  16  15   MONOS  7   5       Chemistry  Recent Labs      07/27/16   1001  07/28/16   0527   NA  139  135*   K  3.9  4.3   CL  98  100   BICARB  23  21*   BUN  9  8   CREAT  0.44*  0.42*   GLU  141*  160*   [place]  9.5  8.7   MG  1.9  1.9   PHOS  3.2  3.0  Recent Labs      07/27/16   1001  07/28/16   0527   ALK  411*  317*   AST  62*  38*   ALT  58*  42*   TBILI  3.12*  2.11*   DBILI  1.8*  1.3*   ALB  3.1*  2.5*       Coags  Recent Labs      07/27/16   1056  07/28/16   0527   PT   --   14.4*   PTT  33  34   INR   --   1.4       Recent Labs      07/27/16   1150  07/27/16   1811  07/27/16   2022   GLUCPOCT  112*  73  193*       Radiology:   CT chest to complete staging. No metastasis.    Microbiology:  none     Physical Exam:  General Appearance: healthy, alert, no distress, pleasant affect, cooperative.  Heart:  rrr.  Lungs: unlabored breathing.  Abdomen: soft, nontender except at biopsy site..  Extremities:  Warm, no edema.    Medications:  IV Meds   sodium chloride 0.45%-potassium chloride 20 mEq 75 mL/hr at 07/28/16 0149    sodium chloride       Scheduled Meds   insulin lispro  1-10 Units 4x Daily WC    lansoprazole  15 mg QAM AC    losartan  25 mg Daily    sodium chloride (PF)  3 mL Q8H    ursodiol  250 mg BID     PRN Meds   acetaminophen  650 mg Q6H PRN    dextrose  12.5 g PRN    glucagon  1 mg Once PRN    glucose  4 tablet PRN    glucose  1 Tube PRN    nalOXone  0.1 mg Q2 Min PRN    ondansetron  4 mg Q6H PRN    oxyCODONE  5 mg Q6H PRN    sodium chloride (PF)  3 mL PRN    sodium chloride   Continuous PRN         ASSESSMENT / PLAN:    51 year old female with R hepatic mass. Biopsy yesterday. No metastasis on staging scans. IR unable to place PTC.     Neuro: oxy and tylenol for pain  CV: home losartan for HTN  Pulmonary: out of bed, incentive spirometry   GI: NPO for possible procedure. Bilirubin trending down but will likely have continued issues with biliary obstruction. Will ask GI if possible to place left bile  duct stent by ERCP. Pathology pending  FEN:  mIVF @ 75  cc/hr. Replete electrolytes as needed.  GU/Renal: Strict In's and Out's.   ID: no infections  Hematology: Hct stable.   Prophylaxis: holding lovenox for possible procedure  PT/OT: Ambulate with assist QID; OOB  Status:  Continue med/surg    Discussed with Dr. Candis Shine, [place] NOTE    Patient Name: Laurie [name]  MRN: [id number]  Room#: VO160/VP710    Service: Surgical Oncology Attending Provider: Gwenlyn Saran, MD    Age: 51 year old   Sex: female  Date: 07/28/16  Hospital Day:   1 day - Admitted on: 07/27/2016  Post Operative Day: 1 Day Post-Op  SUBJECTIVE:  Overnight events: Biopsy of mass yesterday. Unable to perform PTC/PTBD as duct not dilated enough.  Reviewed case with Dr. Rupert Stacks of GI. He does not feel ERCP and stent is technically safe/feasible at this point    Fevers/chills: none  See resident's history and physical examination for further details of the patient's history.    OBJECTIVE:  Lines, Drains and Airways:  Peripheral IV - 20 G Right Antecubital (Active)   Site Assessment Phlebitis 0 07/28/2016  8:25 AM   Line Lumen Status Infusing;Patent 07/28/2016  8:25 AM   Dressing Occlusive;Transparent;Antimicrobial patch 07/28/2016  8:25 AM   Dressing Status Clean, dry, intact 07/28/2016  8:25 AM   Dressing Change Due 08/03/16 07/28/2016  8:25 AM       Vitals signs:   Latest Entry  Range (last 24 hours)    Temperature: 97.9 F (36.6 C)  Temp  Avg: 98.5 F (36.9 C)  Min: 97.9 F (36.6 C)  Max: 98.8 F (37.1 C)    Blood pressure (BP): 107/55  BP  Min: 107/55  Max: 153/65    Heart Rate: 92  Pulse  Avg: 99.6  Min: 84  Max: 114    Respirations: 18  Resp  Avg: 23.2  Min: 18  Max: 28    SpO2: 98 %  SpO2  Avg: 99 %  Min: 96 %  Max: 100 %     Weight: 92.5 kg (204 lb)  Percentage Weight Change (%): 0 %      Intake/Output Summary (Last 24 hours) at 07/28/16 1421  Last data filed at  07/28/16 1252   Gross per 24 hour   Intake            552.5 ml   Output             2300 ml   Net          -1747.5 ml     Intake/Output       07/27/16 0600 - 07/28/16 0559 07/28/16 0600 - 07/29/16 0559      [id number] 1800-0559 Total 0600-1759 3716-9678 Total       Intake    P.O.  --  240 240  --  -- --    I.V.  --  312.5 312.5  --  -- --    Total Intake -- 552.5 552.5 -- -- --       Output    Urine  600  1000 1600  700  -- 700    Total Output 600 1000 1600 700 -- 700       Net I/O     -600 -447.5 -1047.5 -700 -- -700          Labs:  CBC  Recent Labs      07/27/16   1001  07/28/16   0527   WBC  9.5  9.3   HGB  11.0*  9.7*   HCT  35.5  31.1*   PLT  549*  488*   SEG  74  78   LYMPHS  16  15   MONOS  7  5        Chemistry  Recent Labs      07/27/16   1001  07/28/16   0527   NA  139  135*   K  3.9  4.3   CL  98  100   BICARB  23  21*   BUN  9  8   CREAT  0.44*  0.42*   GLU  141*  160*   [place]  9.5  8.7   MG  1.9  1.9   PHOS  3.2  3.0     Recent Labs      07/27/16   1001  07/28/16   0527   ALK  411*  317*   AST  62*  38*   ALT  58*  42*   TBILI  3.12*  2.11*   DBILI  1.8*  1.3*   ALB  3.1*  2.5*       Coags  Recent Labs      07/27/16   1056  07/28/16   0527   PT   --   14.4*   PTT  33  34   INR   --   1.4       Allergies:  No Known Allergies    Medications:  Scheduled Meds   insulin lispro  1-10 Units 4x Daily WC    lansoprazole  15 mg QAM AC    losartan  25 mg Daily    sodium chloride (PF)  3 mL Q8H    ursodiol  250 mg BID       PRN Meds   acetaminophen  650 mg Q6H PRN    dextrose  12.5 g PRN    glucagon  1 mg Once PRN    glucose  4 tablet PRN    glucose  1 Tube PRN    nalOXone  0.1 mg Q2 Min PRN    ondansetron  4 mg Q6H PRN    oxyCODONE  10 mg Q4H PRN    sodium chloride (PF)  3 mL PRN    sodium chloride   Continuous PRN       IV Meds   sodium chloride 0.45%-potassium chloride 20 mEq 75 mL/hr at 07/28/16 0149    sodium chloride         Physical Exam:  General: No acute distress.  Skin: jaundice.  HEENT:  Sclerae icteric.  Abdomen: Soft, non-tender, non-distended.  Psych: Alert and oriented  x 3.  See the resident's physical examination for further details.    Studies:   Entry: 07/27/2016 1:01 PM    Resulting Agency: [facility]            07/27/2016 1:01 PM - Electronic Interface To Epic, Radiant Results      Narrative     EXAM DESCRIPTION:  CT CHEST WITH CONTRAST    CLINICAL HISTORY:  hx of liver mass    TECHNIQUE:  CT scanning was performed through the chest. 75 milliliters of Omnipaque 350 intravenous contrast was used. Sagittal and coronal reformatted images and maximum density projection images were created.             Up-to-date CT equipment and radiation dose reduction techniques were employed. CTDIvol: 6.8 - 7.8 mGy. DLP: 300 mGy-cm.      COMPARISON:  None.    FINDINGS:  LUNGS AND PLEURA:No suspicious pulmonary nodules. Tiny calcified granulomas. No pleural effusion. Clear central main airways.    THYROID: Minimally seen.    LYMPH NODES: No pathologically enlarged thoracic lymph nodes identified.    CARDIOVASCULAR: No significant pericardial effusion. No central pulmonary embolus.    ESOPHAGUS: No significant abnormality of intrathoracic portion of esophagus.    THORACIC SKELETON: No destructive thoracic skeletal lesions.            Impression     IMPRESSION:  1. No evidence of  intrathoracic metastatic malignancy.           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  Pulmonary: Pulmonary toilet, Encourage incentive spirometry 10/hr, Coughing pillow   GI: Feed.  Hepatic: Canceling ERCP. Will need to wait for biliary tree to dilate and re-attempt PTC/PTBD. Ursodiol. Await Path  FEN: HLIV.   Dispo planning.    Spent > 30 minutes coordinating care.    Patient seen/examined.  All questions/concerns answered.    Jefferson Fuel. Maureen Duesing, MD    Associate Professor of Surgery  Division of [facility] [place], [place]     [address]  Mail Code [id number]  [place], [place] [address]    Physician Assistant Mirna Mires): [phone]  Email: [email or web address] .[place] [employer] Erline Levine Silverman): [phone]  Fax: [phone]

## 2016-07-28 NOTE — Consults (Signed)
Gastroenterology/Hepatology Consultation  Fellow: Caren Griffins  Consulting Physician: Dr. Lita Mains  Requesting Physician: Dwaine Deter, MD    Reason For Consultation:   jaundice  Thank you Dr. Donald Pore, Jefferson Fuel, MD for requesting this consultation.    Chief Complaint:   jaundice    History of Present Illness:   51 year old female with a PMH significant for DMII, who presents with jaundice, pruritis, elevated LFTs and liver mass and elevated LFTs c/f malignacy with biliary obstruction. S/p liver biopsy on 2/12.     Pt developed yellow eyes and skin, dark urine, itching around January 20th. Abdominal ultrasound at [facility] on 07/08/16 showed 10cm right lobe liver lesion without biliary dilation. CT abdomen/pelvis showed heterogeneous mass  In the right liver lobe c/f malignancy. 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 SHe was sent to ED from [place] [facility] for biopsy and surgery. She has right sided abdominal pain.     OSH labs:  HAV/HBV/HCV neg.  T bili 6.8, alk phos 556, ALT 146, AST 122  AFP 159  CEA <0.5  [place] 19-9 7.9    Per onc note:   07/09/16: MRI abdomen/MRCP        Current Problem List:   Patient Active Problem List    Diagnosis Date Noted    Liver mass 07/27/2016    Liver mass, right lobe 07/23/2016    DM II (diabetes mellitus, type II), controlled (CMS-HCC) 07/23/2016    Jaundice 07/23/2016    Elevated AFP 07/23/2016       Past Medical History:  Past Medical History:   Diagnosis Date    DM II (diabetes mellitus, type II), controlled (CMS-HCC) 07/23/2016    GERD (gastroesophageal reflux disease)        Past Surgical History:  Past Surgical History:   Procedure Laterality Date    CESAREAN SECTION, LOW TRANSVERSE      times 3    CHOLECYSTECTOMY  2015       Social History:  Social History     Social History    Marital status: Married     Spouse name: N/A    Number of children: N/A    Years of  education: N/A     Social History Main Topics    Smoking status: Never Smoker    Smokeless tobacco: Never Used    Alcohol use No    Drug use: Not on file    Sexual activity: Not on file     Social Activities of Daily Living Present    Not on file     Social History Narrative       Family History:  Family history of colorectal cancer or inflammatory bowel disease:  None    Allergy:  No Known Allergies    Current Medications:  Current Facility-Administered Medications   Medication    acetaminophen (TYLENOL) tablet 650 mg    dextrose 50 % solution 12.5 g    glucagon (GLUCAGON) injection 1 mg    glucose chewable tablet 16 g    glucose oral gel 1 Tube    insulin lispro (HUMALOG) injection 1-10 Units    lansoprazole (PREVACID) DR capsule 15 mg    losartan (COZAAR) tablet 25 mg    magnesium sulfate 2 GM/50ML IVPB 2 g    nalOXone (NARCAN) injection 0.1 mg    ondansetron (ZOFRAN) injection 4 mg    oxyCODONE (ROXICODONE) tablet 5 mg    sodium  chloride (PF) 0.9 % flush 3 mL    sodium chloride (PF) 0.9 % flush 3 mL    sodium chloride 0.45%-potassium chloride 20 mEq infusion    sodium chloride 0.9 % TKO infusion    ursodiol (ACTIGALL) tablet 250 mg       Review of Systems: positive in bold  Constitutional: Negative for weight loss, weakness, anorexia, fever or chills   Eyes: Negative for vision changes   Ears/Throat: Negative for hearing loss or changes, sore throat, or hoarseness   CV: Negative for palpitations, orthopnea, or chest pain   Respiratory: Negative for cough, shortness of breath, sputum, or wheezing   Genitourinary: Negative for dysuria, frequency, urgency, nocturia, or hematuria   GI: See HPI/subjective above   Musculoskeletal: Negative for arthalgia, joint pain, or back problems   Skin: Negative for lesions or rash   Neuro: Negative for syncope, seizure, or dizziness   Psych: Negative for depression or anxiety   Endocrine: Negative for diabetes or thyroid problems   Heme/Lymph: Negative for  bruising or anemia  Allergic/Immunologic: See allergy list    Physical examination:  BP 121/66 (BP Location: Left arm, BP Patient Position: Sitting)   Pulse 97   Temp 98.8 F (37.1 C)   Resp 19   Wt 92.5 kg (204 lb)   SpO2 98%   BMI 41.2 kg/m2 Body mass index is 41.2 kg/(m^2).  Wt Readings from Last 2 Encounters:   07/27/16 92.5 kg (204 lb)   07/27/16 91.8 kg (202 lb 7 oz)      Blood Pressure   07/28/16 121/66   07/27/16 140/74     Temperature:  [98.3 F (36.8 C)-98.8 F (37.1 C)] 98.8 F (37.1 C) (02/13 0744)  Blood pressure (BP): (108-156)/(47-89) 121/66 (02/13 0744)  Heart Rate:  [84-114] 97 (02/13 0744)  Respirations:  [18-28] 19 (02/13 0744)  Pain Score: 7 (02/13 0825)  O2 Device: None (Room air) (02/13 0744)  O2 Flow Rate (L/min):  [2 l/min] 2 l/min (02/12 1600)  SpO2:  [96 %-100 %] 98 % (02/13 0744)    General appearance: no apparent distress  Ears, mouth, throat:  oral mucosa moist   Eyes: minimal scleral icterus  Neck: full range of motion, supple   Lungs: Clear to auscultation, no wheezing, normal respiratory effort   CV: regular rate and rhythm  Abdomen: Soft, ,mild RUQ tenderness non-distended   Musculoskeletal: moves all extremities without difficulty  Extremities: No peripheral edema   Skin: no rash  Psych: Alert and oriented to person, place and time, good insight and judegement, normal affect     Labs:  I have reviewed the labs listed below.    CBC  Lab Results   Component Value Date    WBC 9.3 07/28/2016    HGB 9.7 07/28/2016    HCT 31.1 07/28/2016    MCV 88.6 07/28/2016    PLT 488 07/28/2016     Chemistry  Lab Results   Component Value Date    NA 135 07/28/2016    K 4.3 07/28/2016    CL 100 07/28/2016    BICARB 21 07/28/2016    BUN 8 07/28/2016    CREAT 0.42 07/28/2016    GLU 160 07/28/2016     Liver  Lab Results   Component Value Date    TBILI 2.11 07/28/2016    DBILI 1.3 07/28/2016    AST 38 07/28/2016    ALT 42 07/28/2016    ALK 317 07/28/2016    ALB  2.5 07/28/2016     Coags  Lab Results      Component Value Date    INR 1.4 07/28/2016    PTT 34 07/28/2016       Images:  I have reviewed the imaging reports listed below.    CT chest 07/27/16- FINDINGS:  LUNGS AND PLEURA:No suspicious pulmonary nodules. Tiny calcified granulomas. No pleural effusion. Clear central main airways.    THYROID: Minimally seen.    LYMPH NODES: No pathologically enlarged thoracic lymph nodes identified.    CARDIOVASCULAR: No significant pericardial effusion. No central pulmonary embolus.    ESOPHAGUS: No significant abnormality of intrathoracic portion of esophagus.    THORACIC SKELETON: No destructive thoracic skeletal lesions.    IMPRESSION:  1. No evidence of intrathoracic metastatic malignancy.    2. Recent MRI of the abdomen and CT of abdomen and pelvis are reported separately.    OSH MRI liver      Assessment and Plan:  Patient is a 51 year old female with a PMH significant for DMII, who presents with jaundice, pruritis, elevated LFTs and 10cm liver mass, elevated AFP, elevated LFTs c/f malignacy with biliary obstruction. DDx includes fibrolamellar HCC v. FNH. S/p liver biopsy on 2/12 with results pending. GI consulted for ERCP with stent placement, after IR was unable to place PTC due to absence of dilated biliary ducts. Preliminary review of imaging shows minimal prominence of proximal intrahepatic branches ( proximal to the mass), but normal distal intrahepatic ducts, common hepatic ducts and CBD. Suspect that her elevated LFTs are due to effect of large mass on several small intrahepatic ducts, which are not currently amenable to endoscopic approaches.  Risks of prophylactic ERCP, such as life threatening post procedure pancreatitis, currently outweigh outweigh theoretical benefit. ERCP may be beneficial if patient develops portal adenopathy with subsequent dilation of CBD/ CHD. If pt develops progression of her mass with more significant ductal dilation, this may become amenable to IR drainage. Recommend aggressive  medical management of itching for the time being.    Recommendations:  - recommend medical management of itching (ie hydroxizine)   - repeat labs in 1 week. If chemotherapy is an option for the patient and bilirubin is prohibitive, consider repeat abdominal imaging to assess for increased ductal dilation amenable to IR drainage or ERCP  - will discuss with Dr. Donald Pore    Thank you for the consult.  Please page with any questions.    Patient discussed with attending, Dr. Rupert Stacks    Electronically Signed:   Caren Griffins, MD  GI & Hepatology Fellow  07/28/2016  9:33 AM    ATTENDING INITIAL CONSULT NOTE ATTESTATION    Subjective    Reason for consultation:  Increased bilirubin    History of present illness:  51 year old female with a PMH significant for DMII, who presents with jaundice, pruritis, elevated LFTs and 10cm liver mass, elevated AFP, elevated LFTs c/f malignacy with biliary obstruction. DDx includes fibrolamellar HCC v. FNH. S/p liver biopsy on 2/12 with results pending. GI consulted for ERCP with stent placement, after IR was unable to place PTC due to absence of dilated biliary ducts. Preliminary review of imaging shows minimal prominence of proximal intrahepatic branches ( proximal to the mass), but normal distal intrahepatic ducts, common hepatic ducts and CBD. Suspect that her elevated LFTs are due to effect of large mass on several small intrahepatic ducts, which are not currently amenable to endoscopic approaches.  Risks of prophylactic ERCP, such as  life threatening post procedure pancreatitis, currently outweigh outweigh theoretical benefit. ERCP may be beneficial if patient develops portal adenopathy with subsequent dilation of CBD/ CHD. If pt develops progression of her mass with more significant ductal dilation, this may become amenable to IR drainage. Recommend aggressive medical management of itching for the time being.    Recommendations:  - recommend medical management of itching (ie  hydroxizine)   - repeat labs in 1 week. If chemotherapy is an option for the patient and bilirubin is prohibitive, consider repeat abdominal imaging to assess for increased ductal dilation amenable to IR drainage or ERCP  - will discuss with Dr. Donald Pore    See fellow history and physical for further details of the patient's history.    Objective    I have examined the patient and concur with the fellow exam.    Assessment and Plan    I agree with the fellow care plan.    See the fellow history and physical for further details.

## 2016-07-28 NOTE — Plan of Care (Signed)
Problem: Pain - Acute  Goal: Communication of presence of pain  Outcome: Goal Met  Pt communicates need for pain med, reports moderate to severe pain at puncture site in abdomen. Medicated with PRN oxy. Will continue to assess pain and need for pain meds.    Problem: Fluid Volume Deficit  Goal: Absence of signs and symptoms of fluid volume deficit  Orthostatic hypotension, pulse rate changes, decreased skin turgor, dry mucus membranes, mental status changes.  Outcome: Progressing toward goal, anticipate improvement over: Next 24-48 hours  Pt currently NPO for procedure in AM, plan to resume diet after procedure.

## 2016-07-28 NOTE — Plan of Care (Signed)
Problem: Infection, Surgical Site  Goal: Absence of infection signs and symptoms  Outcome: Goal Met  Pt free from s/s of infection. Puncture site free from erythema, swelling, drainage, odor or other signs of infection. Pt is afebrile, no high white count, pt afebrile   Temperature: 98.8 F (37.1 C) (02/12 2013)  Blood pressure (BP): 127/64 (02/12 2013)  Heart Rate: 107 (02/12 2013)  Respirations: 20 (02/12 2013)  MAP (mmHg): 81 (02/12 2013)  SpO2: 97 % (02/12 2013)      WBC 9.5 (02/12) HGB 11.0* (02/12) PLT 549* (02/12)    HCT 35.5 (02/12)

## 2016-07-28 NOTE — Discharge Instructions (Signed)
Dr. Donald Pore   [facility]      Surgery Postoperative Instructions  ** YOU NEED TO GET LABS EVERY WEEK !       You had no complications and the surgery went well.    You are likely to have pain for the next few days after surgery. You may also feel like you have the flu, and you may have a low fever and feel tired and nauseated. This is common.  You will probably feel much better in 7 days. For several weeks you may feel twinges or pulling at the surgical site.      This care sheet gives you a general idea about how long it will take for you to recover. But each person recovers at a different pace. Follow the steps below to get better as quickly as possible.    You can expect to feel better and stronger each day. You may get tired easily or have less energy than usual. Rest as you need to, but staying comfortably active will help you heal.    How can you care for yourself at home?    Activity    - Arrange for extra help at home after surgery, especially if you live alone or provide care for another person.  - For your safety, you must not drive until you are no longer taking pain medicines and you can move and react easily.  - Avoid heavy lifting or straining right after surgery.  - Be as active as you comfortably can. Do a little more each day. Moving boosts blood flow and helps prevent pneumonia, blood clots, and constipation. Simply walking is excellent activity.  - Avoid strenuous activities for 1-2 weeks after surgery. Examples of these include bicycle riding, jogging, weight lifting, or aerobic exercise.  - Most people are able to return to work within 1 to 2 weeks after surgery.      Diet    - You can return to your normal diet when you feel well. If your stomach is upset, try bland, low-fat foods like plain rice, broiled chicken, toast, and yogurt.  - Avoid alcohol while you are taking prescription pain medicine.  - Many people are constipated after surgery. This can be due to the pain  medicine and a lack of activity. Be sure you get plenty of fluids, and take a fiber supplement such as methylcellulose (Citrucel) or psyllium (Metamucil) or a stool softener like docusate (Colace).      Medicine  - Take pain medicine as needed, following the directions carefully. Do not wait until you are in severe pain. You will get better results if you take it sooner.  - Talk to your doctor or other health care professional before starting any new medicine, including an over-the-counter medicine.  - Do NOT take more than one pain medicine that contains acetaminophen (Tylenol) at the same time. Many over-the-counter medicines, as well as the commonly prescribed pain medicines hydrocodone with acetaminophen (Vicodin, Norco) and oxycodone and acetaminophen (Percocet), contain Tylenol. Too much Tylenol is dangerous. Check the labels carefully.  - To avoid an upset stomach, take your pain pills with food.  - You may resume taking all your home medications as previously prescribed.   - continue lovenox injections for 2 weeks following surgery - your nurse will teach you how to administer prior to discharge     Incision care  - You have small tape (Steri-strips) directly on your incision.  You should leave them  on for a week, or until they loosen and come off on their own.  - You may shower  after the first 48 hours. Do not scrub your incision. You may clean it with plain warm water, and gently pat it dry.  - Keep the area clean and dry.  - Do not soak the incision under water during the first 2 weeks.  - You may have some swelling around your surgery site. This is normal and may take several weeks to go away.      Other instructions      Who to call with questions/concerns:   Monday through Friday daytime - call Mirna Mires, PA at [phone]  Evenings or weekends - Please call Dr. Whitney Muse resident on call at [phone]  If you feel something is wrong, please do not hesitate to contact us. Some concerning  symptoms include:   1. Fevers >101, chills          You will follow up with Dr. Donald Pore at previously scheduled appointment.  An appointment will be scheduled, but you should call the office in 1 week to verify.    When should you call 911?    If you think you are experiencing a medical emergency, call 911 immediately or seek other emergency services. Examples of symptoms that may be an emergency include:    - You pass out (lose consciousness).  - You have chest pain.    When should you call your doctor or other health care professional?    - You are sick to your stomach or cannot keep fluids down.  - You have pain that does not get better after you take pain pills.  - You have a fever over 101 degrees.  - You have signs of infection, such as increasing tenderness, red streaks, or pus from your incision.  - Your swelling is getting worse, and you have bright red bleeding from your incision.  - You have signs of a blood clot, such as unexplained pain or swelling in your leg.  - You have trouble passing urine or stool.  - You are not feeling better day by day.  - You have any problems with your medicine.    If you have any questions or concerns, please call your doctor or the Surgical Clinic where you are receiving your care. After clinic hours, call the Page Operator and ask for the Surgical Resident on call.     [facility] Page Operator [phone]   Ask for the General Surgery Resident on call

## 2016-07-28 NOTE — Discharge Summary (Signed)
Patient Name:  Laurie [name]    Principal Diagnosis (required):  <principal problem not specified>      Hospital Problem List (required):  Active Hospital Problems    Diagnosis    Liver mass [R16.0]      Resolved Hospital Problems    Diagnosis   No resolved problems to display.       Additional Hospital Diagnoses ("rule out" or "suspected" diagnoses, etc.):    Right-sided hepatic mass s/p IR BIOPSY.       Principal Procedure During This Hospitalization (required):  interventional radiology biospy of Right-sided hepatic mass     Other Procedures Performed During This Hospitalization (required):  None    Procedure results are available in Chart Review in Epic.  For those providers external to [place], the key procedure results are listed below:      Consultations Obtained During This Hospitalization:  interventional radiology   Gastroenterology     Key consultant recommendations:  Liver biopsy pending     Reason for Admission to the Hospital / History of Present Illness:  Laurie [name] is a 51 year old female who is here for evaluation of liver mass. Noticed yellow eyes, itching, and found to have elevated LFTs at primary care appointment 2 weeks ago. No abdominal pain. Eating normally. No acholic stools.     Hospital Course by Problem (required):  Pt admitted on yesterday 2/13 after presenting to clinic for evaluation of liver mass. She was admitted for liver biopsy procedure by IR. Today the patient is doing well, c/o of mild pain at the site of the bx,however eating well, walking and passing gas.     Tests Outstanding at Discharge Requiring Follow Up:  Pathology evaluation of liver bx     Discharge Condition (required):  Stable.    Key Physical Exam Findings at Discharge:  No significant physical examination findings at the time of discharge.    Discharge Diet:  Diabetic / low-carbohydrate.    Discharge Medications:     What To Do With Your Medications      START taking these medications     Add'l Info    acetaminophen 325 MG tablet   Commonly known as:  TYLENOL   Take 2 tablets (650 mg) by mouth every 6 hours as needed (pain).    Quantity:  60 tablet   Refills:  0       docusate sodium 250 MG capsule   Commonly known as:  COLACE   Take 1 capsule (250 mg) by mouth 2 times daily as needed for Constipation.    Quantity:  60 capsule   Refills:  0       ondansetron 8 MG tablet   Commonly known as:  ZOFRAN   Take 1 tablet (8 mg) by mouth every 8 hours as needed for Nausea/Vomiting.    Quantity:  30 tablet   Refills:  0       oxyCODONE 5 MG immediate release tablet   Commonly known as:  ROXICODONE   Take 1 tablet (5 mg) by mouth every 4 hours as needed for Severe Pain (Pain Score 7-10).    Quantity:  60 tablet   Refills:  0         CONTINUE taking these medications       Add'l Info    calcium-vitamin D 600-400 MG-UNIT tablet   Commonly known as:  CALTRATE 600+D   Take 1 tablet by mouth 2 times daily.  Refills:  0       glimepiride 4 MG tablet   Commonly known as:  AMARYL   Take 4 mg by mouth every morning.    Refills:  0       losartan 25 MG tablet   Commonly known as:  COZAAR   Take 25 mg by mouth daily.    Refills:  0       metFORMIN 1000 MG tablet   Commonly known as:  GLUCOPHAGE   Take 1,000 mg by mouth 2 times daily (with meals).    Refills:  0       omeprazole 20 MG capsule   Commonly known as:  PRILOSEC   Take 20 mg by mouth daily.    Refills:  0       pioglitazone 30 MG tablet   Commonly known as:  ACTOS   Take 30 mg by mouth daily.    Refills:  0            Where to Get Your Medications      These medications were sent to [facility]  [id number], [place] [address]    Hours:  Mon-Fri 8:30am-7:00pm, Sat-Sun 9am-5:00pm Phone:  [phone]     acetaminophen 325 MG tablet    docusate sodium 250 MG capsule    ondansetron 8 MG tablet         Please check with staff for printed prescription or if prescription was faxed to your pharmacy.     Bring a paper  prescription for each of these medications     oxyCODONE 5 MG immediate release tablet             Allergies:  No Known Allergies    Discharge Disposition:  Home.    Discharge Code Status:  Full code / full care  This code status is not changed from the time of admission.    Follow Up Appointments:  NP will communicate to pt timing and date of f/up visit.   Scheduled appointments:  Future Appointments  Date Time Provider [place]   07/29/2016 11:30 AM Daneil Dan, MD MOS TXP HEP MOS   08/05/2016 1:00 PM Patriciaann Clan, [place] [facility] ONCOLOGY [facility]   [id number] AM Sicklick, Jefferson Fuel, MD MUC Onc MUC       For appointments requested for after discharge that have not yet been scheduled, refer to the Post Discharge Referrals section of the After Visit Summary.    Discharging 11 Contact Information:  [facility] operator at [phone].

## 2016-07-29 ENCOUNTER — Other Ambulatory Visit: Payer: Self-pay

## 2016-07-29 ENCOUNTER — Telehealth (HOSPITAL_BASED_OUTPATIENT_CLINIC_OR_DEPARTMENT_OTHER): Payer: Self-pay | Admitting: Gastroenterology

## 2016-07-29 ENCOUNTER — Ambulatory Visit (HOSPITAL_BASED_OUTPATIENT_CLINIC_OR_DEPARTMENT_OTHER): Payer: Self-pay | Admitting: Gastroenterology

## 2016-07-29 DIAGNOSIS — C220 Liver cell carcinoma: Principal | ICD-10-CM

## 2016-07-29 LAB — URINE CULTURE

## 2016-07-29 SURGERY — ENDOSCOPIC RETROGRADE CHOLANGIOPANCREATOGRAPHY (ERCP)
Anesthesia: General

## 2016-07-29 NOTE — Telephone Encounter (Signed)
S/w patient ans her daughter and she has been scheduled to see [name] on 08/05/16 at 1130 am.

## 2016-07-29 NOTE — Telephone Encounter (Signed)
Pts niece [name] called in to see if pts liver biopsy results were ready for Dr. Cameron Sprang to review and consult with pt today?    Pt spoke with RN Truett Perna. Results are not in yet so appt that was scheduled for today 07/29/16 was rescheduled to 09/03/16 with Dr. Vida Roller as requested per RN for first available with any hepatologist.    Niece is asking if there is a sooner appt based on pts diagnosis? (Liver mass, Liver disorder)    Please advise call center.

## 2016-07-29 NOTE — Telephone Encounter (Signed)
Recd call fr pt's niece requesting to re-schedule New Hep pt appt w Dr Cameron Sprang today. Pt was discharged fr hosp yesterday. Has f/u Oncology appts 2/21 and 08/10/2016.    35 year oldfemale s/p ED to [facility] 2/12-2/13/2018 for evaluation of liver mass.    IR Liver Biopsy 07/27/2016: No path Report in Epic yet.    Transferred call to Call [place] for re-scheduling.

## 2016-07-30 ENCOUNTER — Telehealth (HOSPITAL_BASED_OUTPATIENT_CLINIC_OR_DEPARTMENT_OTHER): Payer: Self-pay

## 2016-07-30 LAB — DES-GAMMA-CARBOXY PROTHROMBIN, BLOOD: Des-Gamma-Carboxy Prothrombin: 607.3 ng/mL — ABNORMAL HIGH (ref 0.0–7.4)

## 2016-07-30 LAB — ALPHA FETOPROTEIN TOTAL & L3 PERCENT
AFP L3%: 18 % — ABNORMAL HIGH (ref 0.0–9.9)
AFP Total: 181 ng/mL — ABNORMAL HIGH (ref 0–15)

## 2016-07-30 NOTE — Telephone Encounter (Signed)
Transitional Telephonic Nurse (TTN) Post-Discharge Follow-Up Phone Call Successful    No issues identified.           Alishea Beaudin C. Labib Cwynar, MSN, RN   [facility]   Transitional Telephone Nurse Coordinator   Telephone #   [phone]  Email : [email or web address]

## 2016-07-31 ENCOUNTER — Encounter (HOSPITAL_BASED_OUTPATIENT_CLINIC_OR_DEPARTMENT_OTHER): Payer: Self-pay | Admitting: Surgical

## 2016-07-31 DIAGNOSIS — C220 Liver cell carcinoma: Principal | ICD-10-CM

## 2016-07-31 NOTE — Procedures (Signed)
FINAL PATHOLOGIC DIAGNOSIS:  A: Liver, biopsy       -Moderately differentiated hepatocellular carcinoma, see comment.  COMMENT:  The biopsy consists of a clear cell neoplasm arranged in markedly  thickened cords.  There is endothelial wrapping.  No background liver is  seen.  Immunohistochemical studies demonstrate that the tumor is positive for HSA,  arginase-1, glypican-3, and LFABP, and negative for pax-8 and inhibin.  A  CD34 immunostain highlights endothelial wrapping.  Overall, the  histomorphology and immunophenotype are consistent with  moderately-differentiated hepatocellular carcinoma.  This case was reviewed intradepartmentally on 07/31/16 (VV).    SPECIMEN(S) SUBMITTED:  A: liver biopsy    CLINICAL HISTORY:  ICD-10 code:  R17; R16.0 Jaundice; liver mass, right lobe.    GROSS DESCRIPTION:  A: The specimen (received in formalin, labeled with the patient's name,  medical record number and "liver") consists of three tan-green needle core  fragments of soft tissue ranging from 0.4 to 1.3 cm in length x 0.1 cm in  diameter each. Also received are multiple tan to tan-green fragments of  soft tissue ranging from <0.1 to 0.2 cm in greatest dimension. The  fragments are filtered and the specimen is entirely submitted in cassettes  A1 to A2.  DKM/jb  SPECIAL STAINS/PROCEDURES: Special stains and/or procedures were used in  the final interpretation. All stains were performed with appropriate  positive and negative controls.  The immunostain(s) reported above were developed and their performance  characteristics determined by the [facility] Department of  Pathology.  They have not been cleared or approved by the U.S. Food and  Drug Administration, although such approval is not required for  analyte-specific reagents of this type.  The FDA has determined that such  clearance is not necessary.  This laboratory is regulated under the  Clinical Laboratory Improvement Amendments of 1988 (CLIA) as qualified  to  perform high complexity clinical testing.  Pursuant to the requirements of  CLIA, this laboratory has established and verified the test's relevant  performance specifications.  Data collected for the verification process  are available upon request.  CONFIDENTIAL HEALTH INFORMATION: Health Care information is personal and  sensitive information. If it is being faxed to you it is done so under  appropriate authorization from the patient or under circumstances that do  not require patient authorization. You, the recipient, are obligated to  maintain it in a safe, secure and confidential manner. Re-disclosure  without additional patient consent or as permitted by law is prohibited.  Unauthorized re-disclosure or failure to maintain confidentiality could  subject you to penalties described in federal and state law.  If you have  received this report or facsimile in error, please notify the [name]  Pathology Department immediately and destroy the received document(s).    Material reviewed and Interpreted and  Report Electronically Signed by:  Loni Muse M.D., Ph.D. [phone])  Attending Surgical Pathologist  07/31/16 10:22  Electronic Signature derived from a single  controlled access password

## 2016-08-03 NOTE — Progress Notes (Unsigned)
Patient seen by Dr. Donald Pore same day    This encounter was opened in error.  Please disregard.

## 2016-08-04 ENCOUNTER — Telehealth (HOSPITAL_BASED_OUTPATIENT_CLINIC_OR_DEPARTMENT_OTHER): Payer: Self-pay | Admitting: Surgical

## 2016-08-04 NOTE — Telephone Encounter (Signed)
AA called pt with Spanish interpreter. AA told pt that location of appointment for 08/05/16 will now be at [facility] instead of [facility]. Pt confirmed apt was still at 1pm but would be at [facility].

## 2016-08-05 ENCOUNTER — Ambulatory Visit (HOSPITAL_BASED_OUTPATIENT_CLINIC_OR_DEPARTMENT_OTHER): Payer: Self-pay | Admitting: Gastroenterology

## 2016-08-05 ENCOUNTER — Ambulatory Visit: Payer: MEDICAID | Attending: Gastroenterology | Admitting: Gastroenterology

## 2016-08-05 ENCOUNTER — Encounter (INDEPENDENT_AMBULATORY_CARE_PROVIDER_SITE_OTHER): Payer: MEDICAID | Admitting: Surgical

## 2016-08-05 ENCOUNTER — Ambulatory Visit (HOSPITAL_BASED_OUTPATIENT_CLINIC_OR_DEPARTMENT_OTHER): Payer: MEDICAID | Admitting: General Surgery

## 2016-08-05 ENCOUNTER — Encounter (HOSPITAL_BASED_OUTPATIENT_CLINIC_OR_DEPARTMENT_OTHER): Payer: Self-pay | Admitting: Surgical

## 2016-08-05 ENCOUNTER — Other Ambulatory Visit (HOSPITAL_BASED_OUTPATIENT_CLINIC_OR_DEPARTMENT_OTHER): Payer: MEDICAID

## 2016-08-05 ENCOUNTER — Encounter (HOSPITAL_BASED_OUTPATIENT_CLINIC_OR_DEPARTMENT_OTHER): Payer: Self-pay | Admitting: Gastroenterology

## 2016-08-05 VITALS — BP 125/73 | HR 105 | Temp 98.3°F | Resp 16 | Ht 59.0 in | Wt 201.1 lb

## 2016-08-05 VITALS — BP 119/66 | HR 99 | Temp 97.4°F | Resp 16 | Ht 59.0 in | Wt 201.7 lb

## 2016-08-05 DIAGNOSIS — D649 Anemia, unspecified: Secondary | ICD-10-CM | POA: Insufficient documentation

## 2016-08-05 DIAGNOSIS — C220 Liver cell carcinoma: Principal | ICD-10-CM

## 2016-08-05 DIAGNOSIS — E1169 Type 2 diabetes mellitus with other specified complication: Secondary | ICD-10-CM | POA: Insufficient documentation

## 2016-08-05 DIAGNOSIS — R17 Unspecified jaundice: Secondary | ICD-10-CM

## 2016-08-05 DIAGNOSIS — IMO0001 Reserved for inherently not codable concepts without codable children: Secondary | ICD-10-CM

## 2016-08-05 DIAGNOSIS — R772 Abnormality of alphafetoprotein: Secondary | ICD-10-CM | POA: Insufficient documentation

## 2016-08-05 DIAGNOSIS — R194 Change in bowel habit: Secondary | ICD-10-CM | POA: Insufficient documentation

## 2016-08-05 DIAGNOSIS — E669 Obesity, unspecified: Secondary | ICD-10-CM | POA: Insufficient documentation

## 2016-08-05 LAB — COMPREHENSIVE METABOLIC PANEL, BLOOD
ALT (SGPT): 47 U/L — ABNORMAL HIGH (ref 0–33)
AST (SGOT): 52 U/L — ABNORMAL HIGH (ref 0–32)
Albumin: 3.1 g/dL — ABNORMAL LOW (ref 3.5–5.2)
Alkaline Phos: 520 U/L — ABNORMAL HIGH (ref 35–140)
Anion Gap: 13 mmol/L (ref 7–15)
BUN: 10 mg/dL (ref 6–20)
Bicarbonate: 27 mmol/L (ref 22–29)
Bilirubin, Tot: 1.79 mg/dL — ABNORMAL HIGH (ref <1.2)
Calcium: 9.9 mg/dL (ref 8.5–10.6)
Chloride: 97 mmol/L — ABNORMAL LOW (ref 98–107)
Creatinine: 0.41 mg/dL — ABNORMAL LOW (ref 0.51–0.95)
GFR: >60 mL/min
Glucose: 151 mg/dL — ABNORMAL HIGH (ref 70–99)
Potassium: 4.2 mmol/L (ref 3.5–5.1)
Sodium: 137 mmol/L (ref 136–145)
Total Protein: 8 g/dL (ref 6.0–8.0)

## 2016-08-05 LAB — CBC WITH DIFF, BLOOD
ANC-Automated: 7.1 10*3/uL — ABNORMAL HIGH (ref 1.6–7.0)
ANC-Instrument: 7.1 10*3/uL — ABNORMAL HIGH (ref 1.6–7.0)
Abs Basophils: 0.1 10*3/uL (ref <0.1)
Abs Eosinophils: 0.1 10*3/uL (ref 0.1–0.5)
Abs Lymphs: 1.7 10*3/uL (ref 0.8–3.1)
Abs Monos: 0.8 10*3/uL (ref 0.2–0.8)
Basophils: 1 %
Eosinophils: 1 %
Hct: 33.6 % — ABNORMAL LOW (ref 34.0–45.0)
Hgb: 10.6 gm/dL — ABNORMAL LOW (ref 11.2–15.7)
Imm Gran %: 2 % — ABNORMAL HIGH (ref <1)
Imm Gran Abs: 0.2 10*3/uL — ABNORMAL HIGH (ref <0.1)
Lymphocytes: 17 %
MCH: 28.2 pg (ref 26.0–32.0)
MCHC: 31.5 g/dL — ABNORMAL LOW (ref 32.0–36.0)
MCV: 89.4 um3 (ref 79.0–95.0)
MPV: 9.5 fL (ref 9.4–12.4)
Monocytes: 8 %
Plt Count: 632 10*3/uL — ABNORMAL HIGH (ref 140–370)
RBC: 3.76 10*6/uL — ABNORMAL LOW (ref 3.90–5.20)
RDW: 18.8 % — ABNORMAL HIGH (ref 12.0–14.0)
Segs: 72 %
WBC: 9.9 10*3/uL (ref 4.0–10.0)

## 2016-08-05 LAB — IBC - IRON BINDING CAPACITY
Iron Saturation: 8 %
Iron: 26 ug/dL — ABNORMAL LOW (ref 37–145)
Total IBC: 336 ug/dL (ref 148–506)
UIBC: 310 ug/dL (ref 112–346)

## 2016-08-05 LAB — FERRITIN, BLOOD: Ferritin: 333 ng/mL — ABNORMAL HIGH (ref 13–150)

## 2016-08-05 MED ORDER — PEG 3350-KCL-NABCB-NACL-NASULF 236 GM OR SOLR
4.00 L | Freq: Once | ORAL | 0 refills | Status: AC
Start: 2016-08-05 — End: 2016-08-05

## 2016-08-05 NOTE — Patient Instructions (Signed)
Surgical Oncology Visit with Dr. Gwenlyn Saran    Date: August 05, 2016   Location: [facility]    Patient Name: [place] Record #: [id number]   DOB: Jun 21, 1965  Age: 51 year old  Sex: female    Service: Surgical Oncology  Attending Provider: Gwenlyn Saran, MD    Assessment and Current Plan:  In summary, this patient is a 51 year old female who presented to her local emergency department with a 5 day history of dark urine, yellow eyes and itchiness, She was told by her primary care physician to come to the hospital for urgent evaluation of an abnormal liver panel. Upon presentation she was noted to abnormal liver panel: total bili 6.8, alkaline phosphatase 556, AST 122, ALT 146. MRI of the abdomen 07/09/16 revealed a 11.3 x 8.0-cm mass in the right liver with radiologic feature consistent with hepatocellular carcinoma ([place]).    Her Hepatology serology was negative on 07/08/16 and her AFP is noted to be elevated to 154 07/09/16. Repeat serology 07/28/16 AFP 181 (L3% = 18) and DCP = 607.     From clinic she was sent to the ED on 07/27/16 for urgent biliary decompression and liver biopsy. AS her main ducts were not dilated, no PTC was placed. Gastroenterology was consulted and they felt that her elevated liver numbers were due to compression of the smaller biliary ducts and that ERCP would potentially offer more risk than benefits.     Final pathology of her liver mass biopsy is consistent with moderately-differentiated hepatocellular carcinoma ([place]). Generally, liver resection (non-resectable), liver transplantation (beyond criteria), hepatic artery embolization versus chemoembolization versus radioembolization (candidate), tumor ablation (beyond criteria), systemic treatment (i.e., sorafenib, nivolumab), and clinical trials are options.     At this time I would recommend:   1. Referral has been placed to Dr. Tenna Child of Medical Oncology.   2. Labs today: CBC, CMP  3. Referral  to Dr. Kalman Shan to assess for TARE.  4. Follow up with Dr. Cameron Sprang of Hepatology.    Authorization for Studies or Procedures:  If needed, our authorization department will call your insurance company to get authorization for any procedure(s). This may take 1-5 working days. Then Leotis Pain  from our office will contact you regarding the date of procedure, any preoperative anesthesia appointment and/or any preparation for the procedure. If you have not heard from [place] within 3-5 days, please feel free to contact her at [phone].      General Telephone Numbers for Scheduling Radiology Studies:  Call radiology to schedule your X-rays, CT scan, or MRI at [phone].   Call nuclear medicine to schedule your PET/ CT scan at [phone].     Questions/Concerns:  Daytime/Weekdays: Should any questions or concerns arise, please contact Dr. Donald Pore at [phone] or Mr. Sherrine Maples at [phone].    Evening/Weekends: If you are having concerns or problems after hours, you may call the hospital operator at [phone] and ask for the Surgical Oncology resident on call. He or she will be in communication with Dr. Donald Pore.    Emergencies: Call 911 or go to your closest emergency department and ask them to contact Dr. Donald Pore at [phone].      Jefferson Fuel. Britiney Blahnik, MD    Associate Professor of Surgery  Division of [facility] [place] [place]     [address]  Mail Code [place], [place] [address]    Physician Assistant Mirna Mires): [phone]  Email: [email or web address] .[place] [employer] Erline Levine Silverman): [phone]  Fax: [phone]

## 2016-08-05 NOTE — Progress Notes (Signed)
[place]    HEPATOLOGIST: Daneil Dan, MD    Date / Time: 08/05/2016 11:26 AM    Referring provider: Maricela Curet     Please send a copy of the consult to the referring physician    Reason for visit: Liver Mass    Type of visit: Consult    History of present illness:   This is a 51 y/o Hispanic female referred to liver clinic with a newly diagnosed liver mass. She is here with her sister and daughters who provide most of the history.  She apparently had a routine PCP visit on 07/08/16 and was noted to have "high AST/ALT" level and was advised to go to ER. She was at [facility]. Had a CT abdomen and MRCP that showed a 11.3 cm mass in the right lobe. She was noted to have yellow eyes then. Also had tiredness and pruritus prior that for a few days.  She was discharged from [facility] with plans for outpt biopsy of the mass.  She apparently did not have insurance at that time and sought care at [facility]. She saw Dr Donald Pore at [facility] on 5/63/14, was admitted to [facility], had a biopsy of the liver mass. This was noted to be moderately differentiated [place].    Currently, she reports pruritus is better with the medication she was given "at [facility]" - hydroxyzine.  No abdominal pain other than some tenderness at the biopsy site.  She reports wt loss of about 13 lbs but this is intentional as well as she is watching her diet.  Urine is "more yellow" than usual. Stool is normal color.  Never had an EGD or Colonoscopy.    With regards to prior history, she had been Diabetic for 18 years. First diagnosed with last pregnancy 18 yr ago. Was on medication since about 8 yr ago. Reports she has had routine PCP f/u for several years but doe snot recollect any abnormal liver enzyme results.      Risk factors for liver disease:  Tested neg. For Hep B, C, HIV  Alcohol: none  Diet & weight history: Currently weighs 201 lbs with BMI 40.7; used to weigh 224 lb at peak - about 2 yr ago. She  averaged around 210 lbs most of her adult life.  Typical diet includes tortillas, rice, beans, red meat. Dinks coke - 2 cans per week or so.  No regular exercise.  No family history of liver disease or cancer    Dietary supplement use: no      Family history:  No family history on file.    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   Nausea / vomitting: no  Thyroid problems: no  Skin rash: no  Ascites:no  Confusion: no  Rest of the review of system was negative. Please refer to the HPI section for the pertinent positives and negatives related to the presenting complaint.      Past Medical History:  Past Medical History:   Diagnosis Date    DM II (diabetes mellitus, type II), controlled (CMS-HCC) 07/23/2016    GERD (gastroesophageal reflux disease)        Past Surgical History:  Past Surgical History:   Procedure Laterality Date    CESAREAN SECTION, LOW TRANSVERSE      times 3    CHOLECYSTECTOMY  2015       Current Medications:    acetaminophen (TYLENOL) 325 MG tablet Take 2 tablets (650 mg) by mouth every 6 hours as needed (pain).   calcium-vitamin D (CALTRATE 600+D) 600-400 MG-UNIT tablet Take 1 tablet by mouth 2 times daily.   docusate sodium (COLACE) 250 MG capsule Take 1  capsule (250 mg) by mouth 2 times daily as needed for Constipation.   glimepiride (AMARYL) 4 MG tablet Take 4 mg by mouth every morning.   losartan (COZAAR) 25 MG tablet Take 25 mg by mouth daily.   metFORMIN (GLUCOPHAGE) 1000 MG tablet Take 1,000 mg by mouth 2 times daily (with meals).   omeprazole (PRILOSEC) 20 MG capsule Take 20 mg by mouth daily.   ondansetron (ZOFRAN) 8 MG tablet Take 1 tablet (8 mg) by mouth every 8 hours as needed for Nausea/Vomiting.   oxyCODONE (ROXICODONE) 5 MG immediate release tablet Take 1 tablet (5 mg) by mouth every 4 hours as needed for Severe Pain (Pain Score 7-10).   pioglitazone (ACTOS) 30 MG tablet Take 30 mg by mouth daily.       Allergy:  No Known Allergies          Physical examination:   BP 119/66 (BP Location: Left arm, BP Patient Position: Sitting, BP cuff size: Regular)   Pulse 99   Temp 97.4 F (36.3 C) (Oral)   Resp 16   Ht 4' 11"  (1.499 m)   Wt 91.5 kg (201 lb 11.2 oz)   SpO2 99%   Breastfeeding? No   BMI 40.74 kg/m2   Wt Readings from Last 2 Encounters:   08/05/16 91.5 kg (201 lb 11.2 oz)   07/27/16 92.5 kg (204 lb)    Blood Pressure   08/05/16 119/66   07/28/16 107/55      General:  Alert and oriented x 3  Affect:  Normal  HEENT:  No scleral icterus, no conjunctival pallor, mucus membranes moist, neck supple without lymphadenopathy, no thryroid abnormalities appreciated  Lungs:  Clear to auscultation bilaterally  Cardiovascular:  No carotid bruit, regular rate and rhythm without murmurs, rubs or gallops..  Abdomen:  Soft; obese abd wall; non-tender  Extremities:  Trace pedal edema around ankles b/l  Skin:  Non-icteric, no palmar erythema, no spider angiomas, and no visible rash  Neuro:  No asterixis    Studies:  Labs: Following labs reviewed  Lab Results   Component Value Date    WBC 9.3 07/28/2016    RBC 3.51 07/28/2016    HGB 9.7 07/28/2016    HCT 31.1 07/28/2016    MCV 88.6 07/28/2016    MCHC 31.2  07/28/2016    RDW 19.5 07/28/2016    PLT 488 07/28/2016    MPV 9.5  07/28/2016      Lab Results   Component Value Date    BUN 8 07/28/2016    CREAT 0.42 07/28/2016    CL 100 07/28/2016    NA 135 07/28/2016    K 4.3 07/28/2016    [place] 8.7 07/28/2016    TBILI 2.11 07/28/2016    ALB 2.5 07/28/2016    TP 6.1 07/28/2016    AST 38 07/28/2016    ALK 317 07/28/2016    BICARB 21 07/28/2016    ALT 42 07/28/2016    GLU 160 07/28/2016        Viral serologies/labs:  Lab Results   Component Value Date    HEPATITISCAB non reactive 07/08/2016      No results found for: HEPBSURFACAB  No results found for: HEPCRNA  No results found for: HEPBDNAQT     Imaging:Outside CT & MRI 07/09/16 with 11.3 mass in the liver right lobe  Outside labs from 07/31/16 - T.bili 2.1    IMPRESSIONS:   51 y/o Hispanic female with a new diagnosis of Moderately differentiated Hepatocellular carcinoma ([place]; 11.3 cm mass rather centrally located) with no suggestion of pre-existing cirrhosis or a prior diagnosis of chronic liver disease.  She certainly has risk factors for NASH with hispanic race, longstanding Obesity and DM-2.  T.bili is currently at 2.1 (improved from 6) and pruritus is well-controlled with hydroxyzine. She was seen by Dr Donald Pore and felt to be unresectable given location of tumor. Also seen by interventional GI as an inpt and ERCP not felt to be useful given no obvious biliary dilation and normal CBD.  CT chest has ruled out any mets to lungs.  AFP 159; DCP 607; AFP-L3% 18      Plan:  1. Discuss case at [facility] on Tuesday to come up with a multidisciplinary plan particularly if IR intervention is feasible. Recommendations will be communicated to her. She already has an appointment with oncology.  2. Bone scan to complete staging work-up  3. EGD + Colonoscopy to r/o luminal primary - schedule first available within 1-2 weeks. She does have some normocytic anemia (unsure if chronic or not) and also notes change in bowel habits recently  4. Labs today to include chronic liver disease w/u.  5.  Continue Hydroxyzine prn.       All questions answered and explained to patient's satisfaction.     Daneil Dan, MD

## 2016-08-05 NOTE — Interdisciplinary (Signed)
Patient Education  Learner: Patient  Method: Explanation and Handout/translator  Verified patient's Name and DOB. Highlighted procedure orders and the numbers to call to schedule. Pharmacy and medications ordered today were also reviewed. Patient was provided with appointment, as well as the phone number of the office in order to cancel or reschedule the appointment.  Response: Demonstrated understandingverbalized understanding

## 2016-08-05 NOTE — Progress Notes (Signed)
Surgical Oncology Follow Up Visit    Demographics:  Date: August 05, 2016   Patient Name: Laurie [name]   Medical Record #: [id number]   DOB: 05-16-1966  Age: 51 year old  Sex: female    MD Requesting Consultation:  Maricela Curet, [address]  [place], [place] [address]    Primary Care Physician:  Maricela Curet    History of Present Illness:     Laurie [name] is a 51 year old female who presented to her local emergency department with a 5 day history of dark urine, yellow eyes and itchiness, She was told by her primary care physician to come to the hospital for urgent evaluation of an abnormal liver panel during follow up for diabetes. Upon presentation she was noted to abnormal liver panel: total bili 6.8, alkaline phosphatase 556, AST 122, ALT 146.  She was hospitalized for two days.    07/09/16: MRI abdomen/MRCP            07/09/16: CT abdomen/pelvis      07/27/16: Patient presents in clinic today for surgical consultation regarding her liver mass. Review of systems as below.     02/12 - 07/28/16: Patient referred to ED for urgent biliary decompression and liver mass biopsy.     08/05/16: Patient presents in clinic for a post-discharge follow up.     Past Medical History:  Past Medical History:   Diagnosis Date    DM II (diabetes mellitus, type II), controlled (CMS-HCC) 07/23/2016    GERD (gastroesophageal reflux disease)      Problem List:  Patient Active Problem List    Diagnosis Date Noted    Hepatocellular carcinoma (CMS-HCC) 07/31/2016    Liver mass 07/27/2016    Liver mass, right lobe 07/23/2016    DM II (diabetes mellitus, type II), controlled (CMS-HCC) 07/23/2016    Jaundice 07/23/2016    Elevated AFP 07/23/2016     Past Surgical History:  Past Surgical History:   Procedure Laterality Date    CESAREAN SECTION, LOW TRANSVERSE      times 3    CHOLECYSTECTOMY  2015     Allergies:  No Known Allergies    Medications:  Current Outpatient  Prescriptions   Medication Sig    acetaminophen (TYLENOL) 325 MG tablet Take 2 tablets (650 mg) by mouth every 6 hours as needed (pain).    calcium-vitamin D (CALTRATE 600+D) 600-400 MG-UNIT tablet Take 1 tablet by mouth 2 times daily.    docusate sodium (COLACE) 250 MG capsule Take 1 capsule (250 mg) by mouth 2 times daily as needed for Constipation.    glimepiride (AMARYL) 4 MG tablet Take 4 mg by mouth every morning.    losartan (COZAAR) 25 MG tablet Take 25 mg by mouth daily.    metFORMIN (GLUCOPHAGE) 1000 MG tablet Take 1,000 mg by mouth 2 times daily (with meals).    omeprazole (PRILOSEC) 20 MG capsule Take 20 mg by mouth daily.    ondansetron (ZOFRAN) 8 MG tablet Take 1 tablet (8 mg) by mouth every 8 hours as needed for Nausea/Vomiting.    oxyCODONE (ROXICODONE) 5 MG immediate release tablet Take 1 tablet (5 mg) by mouth every 4 hours as needed for Severe Pain (Pain Score 7-10).    PEG 3350 with electrolytes (GOLYTELY) 236 G solution Take 4,000 mLs (4 L) by mouth once for 1 dose.    pioglitazone (ACTOS) 30 MG tablet Take 30 mg  by mouth daily.     No current facility-administered medications for this visit.      Social History:  Social History     Social History    Marital status: Married     Spouse name: N/A    Number of children: N/A    Years of education: N/A     Social History Main Topics    Smoking status: Never Smoker    Smokeless tobacco: Never Used    Alcohol use No    Drug use: Not on file    Sexual activity: Not on file     Social Activities of Daily Living Present    Not on file     Social History Narrative    No narrative on file     Family History:  No cancer    Screening:  Mammogram: 2017-normal  Pap Smear: 2017-normal  Colonoscopy: Never    Physical Examination:  BP 125/73 (BP Location: Left arm, BP Patient Position: Sitting, BP cuff size: Regular)   Pulse 105   Temp 98.3 F (36.8 C) (Oral)   Resp 16   Ht 4' 11"  (1.499 m)   Wt 91.2 kg (201 lb 1.6 oz)   SpO2 100%   BMI 40.62  kg/m2  GENERAL: Well developed/well nourished and pleasant/cooperative 51 year old female in no acute distress.  SKIN:  Negative.  NEURO: Non-focal. Normal motor function and sensation to light touch.  PSYCHIATRIC: Alert and oriented to person, place and time.    Labs:     Ref. Range 07/28/2016 05:27   AFP L3% Latest Ref Range: 0.0 - 9.9 % 18.0 (H)   AFP Total Latest Ref Range: 0 - 15 ng/mL 181 (H)   Des-Gamma-Carboxy Prothrombin Latest Ref Range: 0.0 - 7.4 ng/mL 607.3 (H)      07/09/2016 07:38   AFP 159 (H)      07/08/2016 21:15   Hepatitis A Antibody IGM non reactive   Hepatitis C Ab non reactive   Hep B Core Ab, IgM non reactive   Hepatitis B Surface Ag non reactive      07/27/2016 10:01   HIV 1/2 Antibody & P24 Antigen Assay Non Reactive      07/09/2016 07:38   [place] 19-9 7.9   CEA <0.5        07/10/2016 06:00   WBC 9.0   RBC 3.90 (L)   Hgb 10.7 (L)   Hct 34.2 (L)   MCV 88   MCH 27   MCHC 31   RDW 16.3 (H)   Plt Count 436   Lymphocytes 18.2 (L)   Basophils 0.4   Eosinophils 1.1   Monocytes 9.2   Segs 71.1 (H)      07/08/2016 21:15   PT,Patient 10.8   INR 1.0      07/10/2016 06:00   eGFR If NonAfricn Am >60   Glucose 207 (H)   BUN 11   Creatinine 0.4   Sodium 136 (L)   Potassium 3.8   Chloride 101   Bicarbonate 28   Calcium 9.4   AST (SGOT) 66 (H)   ALT (SGPT) 106 (H)   Bilirubin, Total 5.4 (H)   Alkaline Phos 493 (H)   Total Protein 7.1   Albumin 3.2 (L)     Lab Results   Component Value Date    WBC 9.3 07/28/2016    RBC 3.51 07/28/2016    HGB 9.7 07/28/2016    HCT 31.1 07/28/2016    MCV  88.6 07/28/2016    MCHC 31.2 07/28/2016    RDW 19.5 07/28/2016    PLT 488 07/28/2016    MPV 9.5 07/28/2016     Lab Results   Component Value Date    INR 1.4 07/28/2016    PTT 34 07/28/2016     Lab Results   Component Value Date    BUN 8 07/28/2016    CREAT 0.42 07/28/2016    CL 100 07/28/2016    NA 135 07/28/2016    K 4.3 07/28/2016    [place] 8.7 07/28/2016    TBILI 2.11 07/28/2016    ALB 2.5 07/28/2016    TP 6.1 07/28/2016    AST 38  07/28/2016    ALK 317 07/28/2016    BICARB 21 07/28/2016    ALT 42 07/28/2016    GLU 160 07/28/2016     Surgical Pathology: 07/27/16 Liver Biopsy  FINAL PATHOLOGIC DIAGNOSIS:  A: Liver, biopsy       -Moderately differentiated hepatocellular carcinoma, see comment.  COMMENT:  The biopsy consists of a clear cell neoplasm arranged in markedly  thickened cords.  There is endothelial wrapping.  No background liver is  seen.  Immunohistochemical studies demonstrate that the tumor is positive for HSA,  arginase-1, glypican-3, and LFABP, and negative for pax-8 and inhibin.  A  CD34 immunostain highlights endothelial wrapping.  Overall, the  histomorphology and immunophenotype are consistent with  moderately-differentiated hepatocellular carcinoma.  This case was reviewed intradepartmentally on 07/31/16 (VV).    SPECIMEN(S) SUBMITTED:  A: liver biopsy    CLINICAL HISTORY:  ICD-10 code:  R17; R16.0 Jaundice; liver mass, right lobe.    Studies (I personally reviewed the imaging and agree with below):  CT-chest: 08/03/16  LUNGS AND PLEURA:No suspicious pulmonary nodules. Tiny calcified granulomas. No pleural effusion. Clear central main airways.    THYROID: Minimally seen.    LYMPH NODES: No pathologically enlarged thoracic lymph nodes identified.    CARDIOVASCULAR: No significant pericardial effusion. No central pulmonary embolus.    ESOPHAGUS: No significant abnormality of intrathoracic portion of esophagus.    THORACIC SKELETON: No destructive thoracic skeletal lesions.    IMPRESSION:  1. No evidence of intrathoracic metastatic malignancy.    2. Recent MRI of the abdomen and CT of abdomen and pelvis are reported separately.    MRI abdomen: 07/09/16 ([facility])  As noted above    CT-scan of abdomen and pelvis: 07/09/16 ([facility])  As noted above    Assessment/Plan:  In summary, this patient is a 51 year old female who presented to her local emergency department with a 5 day history of dark urine, yellow eyes and itchiness, She  was told by her primary care physician to come to the hospital for urgent evaluation of an abnormal liver panel. Upon presentation she was noted to abnormal liver panel: total bili 6.8, alkaline phosphatase 556, AST 122, ALT 146. MRI of the abdomen 07/09/16 revealed a 11.3 x 8.0-cm mass in the right liver with radiologic feature consistent with hepatocellular carcinoma ([place]).    Her Hepatology serology was negative on 07/08/16 and her AFP is noted to be elevated to 154 07/09/16. Repeat serology 07/28/16 AFP 181 (L3% = 18) and DCP = 607.     From clinic she was sent to the ED on 07/27/16 for urgent biliary decompression and liver biopsy. AS her main ducts were not dilated, no PTC was placed. Gastroenterology was consulted and they felt that her elevated liver numbers were due to compression of  the smaller biliary ducts and that ERCP would potentially offer more risk than benefits.     Final pathology of her liver mass biopsy is consistent with moderately-differentiated hepatocellular carcinoma ([place]). Generally, liver resection (non-resectable), liver transplantation (beyond criteria), hepatic artery embolization versus chemoembolization versus radioembolization (candidate), tumor ablation (beyond criteria), systemic treatment (i.e., sorafenib, nivolumab), and clinical trials are options.     At this time I would recommend:   1. Referral has been placed to Dr. Tenna Child of Medical Oncology.   2. Labs today: CBC, CMP  3. Referral to Dr. Kalman Shan to assess for TARE.  4. Follow up with Dr. Cameron Sprang of Hepatology.    Spent more than 15 of 20 minutes counseling patient, reviewing images and coordinating care.    Patient seen/examined.  All questions/concerns answered.    Jefferson Fuel. Kingslee Dowse, MD    Associate Professor of Surgery  Division of [facility] [place] [place]     [address]  Mail [address]  [place], [place] [address]    Tel:  [phone]  Admin Mauri [place]): [phone]; [email or web address]  Physician Assistant: Mirna Mires, PA-C: [phone]   Fax: [phone]  Pager: [phone]  Email: [email or web address]

## 2016-08-05 NOTE — Patient Instructions (Signed)
Please schedule bone scan, EGD and Colonoscopy  Labs today  Your case will be discussed at multidisciplinary Liver Cancer Group Meeting next week to formulate a treatment plan.    Colonoscopy:   - To schedule your colonoscopy please call the [facility] at   [phone]     - The prescription for your colonoscopy preparation solution (prep) to clean your colon has been faxed to your pharmacy. You can pick it up today.   -  Prepare the colonoscopy prep using the instructions on the jug. Shake well! You can add sugar free lemonade (like Crystal Light) in the water to make it taste better. You can also drink the solution with a straw or refrigerate it.    - We recommend a SPLIT bowel prep. This means taking half the preparation solution the night before and half the morning of your colonoscopy procedure.           1. The instructions on how to split the prep will be sent to you in the mail or given to you in clinic. Sometimes these instruction are DIFFERENT than on the preparation jug. Please follow the instructions in your letter.           2. Splitting the bowel prep as instructed helps clean the right side of the colon. This part of your colon can have flat polyps which are more difficult to detect, so it is very important to get this part of the colon free of stool. Cleaning the right colon is accomplished by taking the second half of the prep approximately 5 hours before your procedure check in time. Don't try to drink the whole thing the night before!

## 2016-08-06 LAB — IMMUNOGLOBULIN PANEL (IGA,IGG,IGM), BLOOD
IGA: 366 mg/dL (ref 70–400)
IGG: 1078 mg/dL (ref 700–1600)
IGM: 41 mg/dL (ref 40–230)

## 2016-08-07 ENCOUNTER — Other Ambulatory Visit (HOSPITAL_BASED_OUTPATIENT_CLINIC_OR_DEPARTMENT_OTHER): Payer: Self-pay | Admitting: Gastroenterology

## 2016-08-07 LAB — HEPATITIS B CORE AB TOTAL: HBcAb Total: NONREACTIVE

## 2016-08-07 NOTE — Telephone Encounter (Signed)
Second read req for ext [place] Abd 07/08/16, CT Abd 07/09/16, MRI/MRCP 07/09/16.

## 2016-08-10 ENCOUNTER — Ambulatory Visit (HOSPITAL_BASED_OUTPATIENT_CLINIC_OR_DEPARTMENT_OTHER): Payer: MEDICAID | Admitting: General Surgery

## 2016-08-11 ENCOUNTER — Ambulatory Visit
Admission: RE | Admit: 2016-08-11 | Discharge: 2016-08-11 | Disposition: A | Discharge status: Home / Self Care | Payer: MEDICAID | Attending: Gastroenterology | Admitting: Gastroenterology

## 2016-08-11 ENCOUNTER — Ambulatory Visit (HOSPITAL_BASED_OUTPATIENT_CLINIC_OR_DEPARTMENT_OTHER)
Admit: 2016-08-11 | Discharge: 2016-08-11 | Disposition: A | Discharge status: Home Health | Payer: MEDICAID | Attending: Gastroenterology | Admitting: Gastroenterology

## 2016-08-11 DIAGNOSIS — R937 Abnormal findings on diagnostic imaging of other parts of musculoskeletal system: Secondary | ICD-10-CM

## 2016-08-11 DIAGNOSIS — M25512 Pain in left shoulder: Secondary | ICD-10-CM | POA: Insufficient documentation

## 2016-08-11 DIAGNOSIS — C220 Liver cell carcinoma: Principal | ICD-10-CM | POA: Insufficient documentation

## 2016-08-11 MED ORDER — TECHNETIUM TC 99M MEDRONATE IV KIT
25.2000 | PACK | Freq: Once | INTRAVENOUS | Status: AC
Start: 2016-08-11 — End: 2016-08-11
  Administered 2016-08-11: 25.2 via INTRAVENOUS
  Filled 2016-08-11: qty 26

## 2016-08-12 ENCOUNTER — Encounter (HOSPITAL_BASED_OUTPATIENT_CLINIC_OR_DEPARTMENT_OTHER): Payer: Self-pay | Admitting: Hematology & Oncology

## 2016-08-12 ENCOUNTER — Ambulatory Visit: Payer: MEDICAID | Attending: Surgical | Admitting: Hematology & Oncology

## 2016-08-12 ENCOUNTER — Other Ambulatory Visit (HOSPITAL_BASED_OUTPATIENT_CLINIC_OR_DEPARTMENT_OTHER): Payer: MEDICAID

## 2016-08-12 VITALS — BP 140/70 | HR 94 | Temp 98.8°F | Resp 16 | Ht 59.0 in | Wt 195.6 lb

## 2016-08-12 DIAGNOSIS — C220 Liver cell carcinoma: Principal | ICD-10-CM

## 2016-08-12 DIAGNOSIS — G893 Neoplasm related pain (acute) (chronic): Secondary | ICD-10-CM | POA: Insufficient documentation

## 2016-08-12 DIAGNOSIS — D649 Anemia, unspecified: Secondary | ICD-10-CM | POA: Insufficient documentation

## 2016-08-12 DIAGNOSIS — R17 Unspecified jaundice: Secondary | ICD-10-CM | POA: Insufficient documentation

## 2016-08-12 LAB — CBC WITH DIFF, BLOOD
ANC-Automated: 7.4 10*3/uL — ABNORMAL HIGH (ref 1.6–7.0)
ANC-Instrument: 7.4 10*3/uL — ABNORMAL HIGH (ref 1.6–7.0)
Abs Basophils: 0.1 10*3/uL (ref <0.1)
Abs Lymphs: 1.4 10*3/uL (ref 0.8–3.1)
Abs Monos: 0.8 10*3/uL (ref 0.2–0.8)
Basophils: 1 %
Hct: 33.2 % — ABNORMAL LOW (ref 34.0–45.0)
Hgb: 10.4 gm/dL — ABNORMAL LOW (ref 11.2–15.7)
Imm Gran %: 1 % (ref <1)
Imm Gran Abs: 0.1 10*3/uL (ref <0.1)
Lymphocytes: 14 %
MCH: 27.4 pg (ref 26.0–32.0)
MCHC: 31.3 g/dL — ABNORMAL LOW (ref 32.0–36.0)
MCV: 87.4 um3 (ref 79.0–95.0)
MPV: 9.4 fL (ref 9.4–12.4)
Monocytes: 8 %
Plt Count: 682 10*3/uL — ABNORMAL HIGH (ref 140–370)
RBC: 3.8 10*6/uL — ABNORMAL LOW (ref 3.90–5.20)
RDW: 18 % — ABNORMAL HIGH (ref 12.0–14.0)
Segs: 76 %
WBC: 9.8 10*3/uL (ref 4.0–10.0)

## 2016-08-12 LAB — COMPREHENSIVE METABOLIC PANEL, BLOOD
ALT (SGPT): 54 U/L — ABNORMAL HIGH (ref 0–33)
AST (SGOT): 62 U/L — ABNORMAL HIGH (ref 0–32)
Albumin: 3.1 g/dL — ABNORMAL LOW (ref 3.5–5.2)
Alkaline Phos: 599 U/L — ABNORMAL HIGH (ref 35–140)
Anion Gap: 16 mmol/L — ABNORMAL HIGH (ref 7–15)
BUN: 9 mg/dL (ref 6–20)
Bicarbonate: 24 mmol/L (ref 22–29)
Bilirubin, Tot: 2.23 mg/dL — ABNORMAL HIGH (ref <1.2)
Calcium: 9.9 mg/dL (ref 8.5–10.6)
Chloride: 98 mmol/L (ref 98–107)
Creatinine: 0.37 mg/dL — ABNORMAL LOW (ref 0.51–0.95)
GFR: >60 mL/min
Glucose: 145 mg/dL — ABNORMAL HIGH (ref 70–99)
Potassium: 4.2 mmol/L (ref 3.5–5.1)
Sodium: 138 mmol/L (ref 136–145)
Total Protein: 8.4 g/dL — ABNORMAL HIGH (ref 6.0–8.0)

## 2016-08-12 LAB — APTT, BLOOD: PTT: 37 s — ABNORMAL HIGH (ref 25–34)

## 2016-08-12 LAB — PROTHROMBIN TIME, BLOOD
INR: 1.3
PT,Patient: 14.1 s — ABNORMAL HIGH (ref 9.7–12.5)

## 2016-08-12 NOTE — Progress Notes (Signed)
GASTROINTESTINAL MEDICAL ONCOLOGY NOTE    DEMOGRAPHICS:  Date: August 12, 2016   Patient Name: Laurie Laurie [name]   Medical Record #: [id number]   DOB: March 13, 1966  Age: 51 year old  Sex: female    REQUESTED BY: Patriciaann Clan    CHIEF COMPLAINT: Hepatocellular Carcinoma    HPI: Laurie Laurie [name] is a 51 year old year old female with hepatocellular carcinoma.    ONCOLOGIC HISTORY (adapted from review of outside and EPIC records and interview with the patient):  -07/08/16-07/10/16: Sent to [facility] ER by her PCP for elevated bilirubin and one week history of painless jaundice and admitted for work-up. Tbili 6.8 with AST 122 and ALT 146 and alk phos 556. Hep A IgM, HepB sAg, HepB core IgM, and Hep C Ab were all non-reactive. CEA <0.5. [place] 19-9 of 7.9. AFP 159. RUQ ultrasound with irregular 10cm echogenic soft tissue mass within the right hepatic lobe. CT A/P with contrast with 11 x 8 cm subtle heterogenous low-density region in the mid-right lobe of the liver. MRI abdomen with MRCP +/- Gadavist with 11.3cm mass in the right lobe of the liver.  -07/27/16: Seen by Dr. Gwenlyn Saran in surgical oncology. Referred to ER for consideration of biliary decompression and biopsy of liver mass.  -07/27/16-07/28/16: Admitted to [facility]. CT chest with contrast with no evidence of intrathoracic metastatic malignancy. Underwent biopsy of liver mass. Percutaneous biliary drain and/or ERCP deferred given lack of ductal dilation. Tbili 3.12 --> 2.11. Pathology consistent with moderately differentiated hepatocellular carcinoma. Immunohistochemical studies demonstrated that the tumor is positive for HSA, arginase-1, glypican-3, and LFABP, and negative for pax-8 and inhibin. A CD34 immunostain highlights endothelial wrapping.  -08/05/16: Seen by Dr. Daneil Dan in hepatology. Plan for review at [facility]. Bone scan to complete staging. EGD/colonoscopy to r/o luminal primary.  -08/11/16: Bone scan  with no definite evidence for osseous metastatic involvement. [facility] review of outside imaging with second read notable for large heterogeneously arterially enhancing hepatic lesion with prompt washout centered in the right hepatic lobe extending toward the hilum and into the left hepatic lobe, causing encasement of the main, left and right portal veins, compression and displacement of the hepatic veins and compression of the IVC. Likely upstream biliary ductal dilation and irregularity. Findings are consistent with biopsy-proven HCC. No definite evidence of extrahepatic disease or tumor in vein. No morphologic evidence of cirrhosis or portal hypertension. Recommendation for consideration of TARE vs systemic therapy.    CURRENT SYMPTOMS:  Here today with daughter Laurie [name] Better), who assists with Spanish translation at the patient request, for establishment of care. She is overall feeling well but is nervous about her new cancer diagnosis. She has lost 20# in the past month. She is having several small hard stools daily but feels constipated. She has intermittent RUQ pain, 4/10, for which she is occasionally taking oxycodone, worsened by standing, and relieved by lying down. Also notes fatigue.    ECOG PERFORMANCE STATUS: 1    PAST MEDICAL AND SURGICAL HISTORY:  Past Medical History:   Diagnosis Date    DM II (diabetes mellitus, type II), controlled (CMS-HCC) 07/23/2016    GERD (gastroesophageal reflux disease)     Hepatocellular carcinoma (CMS-HCC)     Hypertension      Past Surgical History:   Procedure Laterality Date    CESAREAN SECTION, LOW TRANSVERSE      times 3    CHOLECYSTECTOMY  2015  SOCIAL HISTORY:  Lives in [place] with her husband and three adult daughters. 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 Denies tobacco, alcohol, or illicit drug use.    FAMILY HISTORY:  No family history of cancer.    CURRENT MEDICATIONS:  Current Outpatient Prescriptions   Medication Sig   • acetaminophen (TYLENOL) 325  MG tablet Take 2 tablets (650 mg) by mouth every 6 hours as needed (pain).   • glimepiride (AMARYL) 4 MG tablet Take 4 mg by mouth every morning.   • losartan (COZAAR) 25 MG tablet Take 25 mg by mouth daily.   • metFORMIN (GLUCOPHAGE) 1000 MG tablet Take 1,000 mg by mouth 2 times daily (with meals).   • omeprazole (PRILOSEC) 20 MG capsule Take 20 mg by mouth daily.   • ondansetron (ZOFRAN) 8 MG tablet Take 1 tablet (8 mg) by mouth every 8 hours as needed for Nausea/Vomiting.   • oxyCODONE (ROXICODONE) 5 MG immediate release tablet Take 1 tablet (5 mg) by mouth every 4 hours as needed for Severe Pain (Pain Score 7-10).   • pioglitazone (ACTOS) 30 MG tablet Take 30 mg by mouth daily.     No current facility-administered medications for this visit.        ALLERGIES:  Review of patient's allergies indicates no known allergies.    REVIEW OF SYSTEMS:  GEN: +Weight loss. No fevers or chills  EYES: No change in vision.  ENT: No change in hearing. No epistaxis.   PULM: No dyspnea, productive cough, or wheezing  CARDIO: No chest pain, tachycardias, dyspnea on exertion.  GI: +RUQ pain and constipation. No hematemesis or diarrhea.  GU: No dysuria or hematuria.  ENDO: +Diabetes.  JOINT: No new joint pains.  HEME/LYMPHATIC: No bleeding or lymphadenopathy.  NEURO: No loss of consciousness, headaches.    PHYSICAL EXAMINATION:  BP 140/70 (BP Location: Left arm, BP Patient Position: Sitting, BP cuff size: Regular)   Pulse 94   Temp 98.8 °F (37.1 °C) (Oral)   Resp 16   Ht 4' 11" (1.499 m)   Wt 88.7 kg (195 lb 9.6 oz)   SpO2 100%   BMI 39.51 kg/m2  GENERAL APPEARANCE: The patient is an alert, cooperative female in no acute distress.  SKIN: No lesions or rashes.  EYES: PERRLA, EOMs are intact. No icterus.   EAR, NOSE, AND THROAT: The orophynx is clear.   LYMPH NODES: There is no cervical or axillary inguinal adenopathy.  CHEST: The lungs are clear to auscultation.  HEART: Regular rate and rhythm.   ABDOMEN: The abdomen is soft and  nontender. There is no distension or fluid wave.  EXTREMITES: There is no edema or cyanosis. Joints are normal without redness or swelling.  NEURO: Mental status is normal. No focal neurologic findings.    LABS:  Lab Results   Component Value Date    BUN 9 08/12/2016    CREAT 0.37 08/12/2016    CL 98 08/12/2016    NA 138 08/12/2016    K 4.2 08/12/2016    CA 9.9 08/12/2016    BICARB 24 08/12/2016    GLU 145 08/12/2016     Lab Results   Component Value Date    AST 62 08/12/2016    ALT 54 08/12/2016    ALK 599 08/12/2016    TP 8.4 08/12/2016    ALB 3.1 08/12/2016    TBILI 2.23 08/12/2016    DBILI 1.3 07/28/2016     Lab Results   Component Value   Date    WBC 9.8 08/12/2016    RBC 3.80 08/12/2016    HGB 10.4 08/12/2016    HCT 33.2 08/12/2016    MCV 87.4 08/12/2016    MCHC 31.3 08/12/2016    RDW 18.0 08/12/2016    PLT 682 08/12/2016    MPV 9.4 08/12/2016    LYMPHS 14 08/12/2016    MONOS 8 08/12/2016    EOS 1 08/05/2016    BASOS 1 08/12/2016     Lab Results   Component Value Date    INR 1.3 08/12/2016    PTT 37 08/12/2016     Lab Results   Component Value Date    AFPT 181 07/28/2016     Lab Results   Component Value Date    AFPL 18.0 07/28/2016     Lab Results   Component Value Date    DCP 607.3 07/28/2016     Lab Results   Component Value Date    IRON 26 08/05/2016    IRONSAT 8 08/05/2016    TIBC 336 08/05/2016    FERRITIN 333 08/05/2016       IMAGING:  Imaging studies were reviewed in [facility]. Pertinent findings are summarized in the oncologic history.    PATHOLOGY:  Pathology reports were reviewed. Pertinent findings are summarized in the oncologic history.    ASSESSMENT/PLAN:    ICD-10-CM ICD-9-CM   1. Hepatocellular carcinoma (CMS-HCC) C22.0 155.0   2. Elevated bilirubin R17 277.4   3. Jaundice R17 782.4   4. Normocytic anemia D64.9 285.9   5. Cancer related pain G89.3 338.3       1. Hepatocellular carcinoma. Noted to have large heterogeneously arterially enhancing hepatic lesion with prompt washout centered in the right  hepatic lobe extending toward the hilum and into the left hepatic lobe, causing encasement of the main, left and right portal veins, compression and displacement of the hepatic veins and compression of the IVC after presenting with jaundice in January 2018. Biopsy confirmed hepatocellular carcinoma. No evidence of extrahepatic disease or distant metastases. She has been evaluated by hepatobiliary/surgical oncology and is not a resection candidate. She is fit enough to tolerate systemic therapy (Child-Pugh B7); however given her localized disease, I would favor starting with an arterially-directed approach. She sees Dr. Rose in IR next in consultation for discussion of these options. In the meantime, we will profile her tumor biopsy specimen with next-generation sequencing for cancer-related genes and immunohistochemical expression of PD1/PDL1 and MMR proteins (Caris) as well at collect peripheral blood for circulating tumor DNA analysis (Guardant) for assistance in planning eventual systemic therapy.    2. Elevated bilirubin/jaundice. Ductal dilation at presentation that has improved without intervention. Will monitor jaundice and bilirubin closely and consider percutaneous drainage or ERCP if recurs.    3. Normocytic anemia. Iron panel with low iron and percent saturation but ferritin is normal/elevated. Suspect mixed picture iron anemia of inflammation with possible concomitant iron deficiency. Will check soluble transferrin receptor with next labs. Dr. [name] has plans for evaluation of possible intraluminal bleeding source with EGD and colonoscopy.    4. Cancer-related pain. Well controlled on oxycodone 5mg PO Q4H PRN (Rx for #60 on 07/28/16 by surgical oncology)    RTC 1 month    A total of 60 minutes were spent on the above activities and over 45 minutes were spent in face-to-face consultation with the patient/family    Adam M. Burgoyne, MD/PhD    CC:  Mallory, Robert J; Sicklick, Jason; [name], Naveen;   Rose,  Steven

## 2016-08-12 NOTE — Patient Instructions (Addendum)
.  Follow up with Dr. Tenna Child in 1 month.   *Proceed with your appointment with Dr. Kalman Shan next week.   *Please go for lab work today.   *Please restart the medications discussed with you today.   *Add Senna 8.6 mg tablet, start with one at bedtime, increase as discussed.     [place] Phone List:  Hours of Operation: Monday-Friday 8:00- 5:00p.m. (Closed Holidays and Weekends)    For questions on weekdays, (please note that I am not in the office on Tuesdays), please call:  Nurse Case Manager Markus Jarvis RN, OCN  Phone number: [phone]                  [place] for Dr. Tenna Child- Peggye Fothergill  Phone: [phone]   Fax: [phone]   E-Mail: [email or web address]    **For medication refills please have your pharmacy fax a request to [phone]    **WE CAN NOT FAX OR CALL IN PAIN MEDICATIONS**    LETTERS/FORMS: WILL TAKE 3-5 DAYS TO BE COMPLETED BY DR. Tenna Child. PLEASE CONTACT ADMINISTRATIVE ASSISTANT Peggye Fothergill at [phone] to arrange completion.    After hours, please call [phone] and ask for the after hours oncologist on-call.    Information Desk [phone]   ** General information: directions, telephone numbers, or available services.    To schedule CT's/MRI's - [facility] [phone]  **Call to schedule, cancel, or reschedule clinic appointments for Dr. Tenna Child**  You may also contact Administrative Assistant, Peggye Fothergill for scheduling needs, at [phone].        Infusion Center: [phone]   ** Call to schedule, cancel, or reschedule chemotherapy appointments.    Retail Pharmacy at [facility]    Procedure Suite: [phone]    Radiation Oncology: [phone]  ** Call to schedule, cancel, or reschedule radiation appointments.    Social Worker: Neysa Bonito, LCSW  Phone number: [phone]    If you do not have [place] and are interested signing up, please callMyUCSDChart CustomerService at [phone] (Monday through Friday,9AM to 4PM).

## 2016-08-12 NOTE — Interdisciplinary (Signed)
Patient presents today for initial consult by Dr. Tenna [name], referred by Dr. Donald [name]. Laurie [name], 07/27/16 liver bx - moderately-differentiated hepatocellular carcinoma. Laurie [name] Laurie [name] states that Dr. Donald [name] advised her that her tumor is too large to be surgically removed at this time, so referring to Dr. Tenna [name] for treatment to hopefully decrease the size, enabling surgical removal in the future.     Social Hx:  Lives in [place] with her husband and 3 daughters, ages 53,23, & 84.  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   Smoke: Never  Alcohol: Denies  Illicits: Denies  Pacemaker: Denies  Family Hx of cancer: None, other family history in EPIC.     Nursing Assessment:  Weight: 20 lb loss since January, but daughter Laurie [name] Laurie [name] states likely due to dietary changes, no breads, no rice, no red meat, etc.   Pain: Denies current pain, but does experience pain to RUQ that radiates around to her back if she stands or walks for extended periods.  Pain is almost immediately relieved by laying down.   Fatigue: Denies-Able to perform ADL's independently.  Insomnia: Denies  Recent Fever/Chills: None  Headache/dizziness: Denies   Mucositis: Denies, and no s/s of mucositis visible on exam  Chest pain: Denies  Dyspnea: At rest: Denies     On Exertion: Denies     Oxygen Use: None   Port site: No port                  Pacemaker or implanted cardiac device: Denies  Appetite: Fair - eating 100 % of smaller portion meals.  States eating appoximately 60 % of what she was eating prior to January.  Nausea/Vomiting:  Ascities: None noted on exam  Neuropathy: Denies  Skin: Intact, no rashes or wounds observed or reported.   Diarrhea/constipation: Last BM yesterday, reports "only pellets 5-6 times daily", importance of adequate hydration discussed, as well as adding senna 8.6 mg tabs in escalating doses as needed.   Denies dysuria/hematuria  Edema: None  noted on exam    Distress Assessment: Patient is accompanied by her daughter Laurie [name] Laurie [name]. The patient's affect/mood is appropriate to situation.  Patient declined to complete wellbeing screening tool during today's visit, there are no identified needs at this time. Active listening provided, emotional support extended.  Aware of referral to Social Work or other specialties, as needed for supportive care.    Personal and/or social support system consists: Family and friends.     Spiritual/Cultural Practice Preferences: Not addressed during today's visit.    Care Management Assessment: Personal difficulties related to treatment addressed to patient's satisfaction. Transportation, home environment, ability to identify and report untoward or adverse effects, ability to engage in self care and/or provide care, willingness to participate in and adhere to the treatment plan all without noted barriers.     Education:Verbal & written instruction provided regarding adding senna to regiment to facilitate regular bowel movements, importance of hydration, and follow up care plan, importance of lab draw today. Side effects and management discussed.     Patient is aware of when to call MD.     Barriers to learning assessed: No barriers noted. Patient verbalized understanding of provided education.     Plan: Follow up with Dr. Tenna [name] in 1 month.   *Proceed with your appointment with Dr. Kalman [name] in Interventional Radiology next week.   *Please go for lab work today.   *  Please restart the medications discussed with you today.   *Add Senna 8.6 mg tablet, start with one at bedtime, increase as discussed.     Contact information and AVS provided and reviewed in relation to plan of care and diagnosis. All patient/family questions reviewed and answered to satisfaction of all concerned. Understanding verbalized of information provided. Patient/Family aware to call with any questions or concerns that may arise.

## 2016-08-17 ENCOUNTER — Telehealth (HOSPITAL_BASED_OUTPATIENT_CLINIC_OR_DEPARTMENT_OTHER): Payer: Self-pay | Admitting: Gastroenterology

## 2016-08-17 ENCOUNTER — Encounter (HOSPITAL_BASED_OUTPATIENT_CLINIC_OR_DEPARTMENT_OTHER): Payer: Self-pay | Admitting: Hematology & Oncology

## 2016-08-18 ENCOUNTER — Ambulatory Visit: Payer: MEDICAID | Attending: Surgical | Admitting: Diagnostic Radiology

## 2016-08-18 ENCOUNTER — Encounter (HOSPITAL_BASED_OUTPATIENT_CLINIC_OR_DEPARTMENT_OTHER): Payer: Self-pay | Admitting: Diagnostic Radiology

## 2016-08-18 ENCOUNTER — Other Ambulatory Visit: Payer: Self-pay | Admitting: Surgical

## 2016-08-18 ENCOUNTER — Telehealth (HOSPITAL_BASED_OUTPATIENT_CLINIC_OR_DEPARTMENT_OTHER): Payer: Self-pay | Admitting: Gastroenterology

## 2016-08-18 VITALS — BP 155/80 | HR 97 | Temp 97.7°F | Resp 16 | Wt 193.9 lb

## 2016-08-18 DIAGNOSIS — C220 Liver cell carcinoma: Principal | ICD-10-CM | POA: Insufficient documentation

## 2016-08-18 DIAGNOSIS — R772 Abnormality of alphafetoprotein: Secondary | ICD-10-CM | POA: Insufficient documentation

## 2016-08-18 DIAGNOSIS — E118 Type 2 diabetes mellitus with unspecified complications: Secondary | ICD-10-CM | POA: Insufficient documentation

## 2016-08-18 NOTE — Telephone Encounter (Signed)
Chart reviewed. Noted pt had app'ts yesterday with IR and Onc. She was informed of neg Bone Scan results at both app'ts.

## 2016-08-18 NOTE — Patient Instructions (Addendum)
[place]                        Tel   [phone]               Tel   [phone]  Fax  [phone]                    [facility] [place]: [phone]      Fax  [phone]  __________________________________________________________________________________________________________    [place] INTERVENCIONISTA  INSTRUCCIONES PARA EL PACIENTE    Nombre del procedimiento:  Transarterial chemoembolization of the liver     Mdico encargado de la atencin:  Dr. Remus Loffler del procedimiento: September 07, 2016    Mellody Drown de llegada: 8:00 AM   Mellody Drown del procedimiento: 9:00 AM    Lugar:  [employer], [place], [place], [place] [address]    Regstrese en el mostrador de Radiologa, 1er piso.    A la hora de llegada, regstrese segn se indica anteriormente. [place] 8 p.m. y las 5 a.m., use la entrada del Departamento de [place]. SU PROCEDIMIENTO PODRA CANCELARSE SI NO LLEGA A TIEMPO. Si se retrasa o si tiene un resfriado, fiebre o cualquier otra afeccin que podra indicar la necesidad de posponer el procedimiento, llame al coordinador de [employer],   [phone], Anne Shutter, [phone], de 8 a.m. a 5 p.m., de lunes a viernes. Despus del horario laboral, los das festivos y los fines de [place], llame [facility]) al   [phone] y pida hablar con el mdico de radiologa intervencionista de [place]. Si no acudi a la clnica para que lo examinaran la semana anterior al procedimiento, el coordinador de enfermera de [place] lo llamar el da antes para confirmar la hora de llegada y las instrucciones, as como para responder sus preguntas.    No fume al menos 24 horas antes de su procedimiento. Ms Orbie Hurst se indica si se necesitan pruebas o anlisis de laboratorio antes  del procedimiento, los cuales se pedirn a travs del sistema informtico EPIC de [place]. Las salas de extraccin de sangre en cada hospital estn abiertas de lunes a viernes, de 7:30 a.m. a 4 p.m., excepto los [employer].   TOME LOS MEDICAMENTOS DE POR LA [place] CON UN [place]. NO tome medicamentos para la diabetes en este momento.   ORGANICE QUE UN ADULTO LE [place] A CASA DESPUS DEL PROCEDIMIENTO.  EL PROCEDIMIENTO SE CANCELAR SI NO ESTO NO EST ORGANIZADO. Por su seguridad, no use el transporte pblico.    X   No coma ni beba nada despus de la medianoche la noche antes del procedimiento      INSTRUCCIONES ESPECIALES: Haga lo siguiente SOLO si el mdico/enfermero profesional se lo indica:  ______Si Ky Barban mdico/enfermero  profesional ha decidido que Nurse, mental health, llame cuanto antes a la Clnica de Anestesia Preoperatoria ([facility]) al [phone] para programar una consulta con ellos ANTES de la fecha del procedimiento.  ______METFORMINA: Si est tomando metformina (Glucophage, Glucovance, Avandamet) y en el procedimiento se va a usar contraste intravenoso (IV), NO la tome el da del procedimiento ni durante las 29 horas posteriores. Es necesario realizar una prueba de sangre renal (BUN/creatinina) 48 horas despus del procedimiento antes de que pueda volver a tomar estos medicamentos de [facility]. Comunquese con el mdico que le dio la receta para prescripciones relacionadas con el control de la glucosa durante este tiempo.  _____MEDICAMENTOS ORALES PARA LA DIABETES: SUSPENDA la dosis por la maana de medicamentos orales para la diabetes el da del procedimiento.    OTRAS INSTRUCCIONES __________________________________________________________________  EL DA DEL PROCEDIMIENTO:   Dchese o bese antes de venir al hospital. Ellen Henri maquillaje y el esmalte de las uas.   Lleve ropa cmoda y holgada. Deje todos los artculos de valor en casa, incluido dinero y  [place].   Sheela Stack su tarjeta del seguro y [place] identificacin con foto.   La familia puede esperar en el rea de espera de radiologa (Radiology Waiting Area) en Occupational psychologist.   Un enfermero lo acompaar al rea de preparacin de radiologa intervencionista (IR Holding Area) para que se quite la ropa y se ponga una bata de hospital. El enfermero tomar la temperatura, el pulso, la presin arterial y la saturacin de oxgeno, y Optometrist las preparaciones especiales necesarias.   Se insertar un catter intravenoso (un pequeo tubo que se introduce en la vena para darle lquidos y medicamentos).   Si se necesita anestesia, un miembro del Departamento de [place] lo visitar.   Cuando est listo, se le llevar a la Paramedic. El equipo est integrado por mdicos, enfermeros profesionales y tecnlogos de radiologa profesionales que asistirn para que usted est lo ms cmodo posible. Hay msica disponible; haga saber a su equipo si tiene Arts development officer.   Despus del procedimiento, se le transferir a la Unidad de Cuidados Postanestesia (Post Anesthesia Care Unit, PACU). Se vigilar su estado, constantes vitales y nivel de dolor. La duracin de su estada depender de su estado, pero normalmente es de 2 a 6 horas. El personal de Aeronautical engineer a su familia informada de su progreso y los llamar cuando usted est listo para el alta.                                                                         Nmeros de telfono importantes    Si usted est gravemente enfermo, llame al 911 o acuda a la sala de emergencias ms cercana.    Si tiene alguna pregunta clnica o un problema relacionado con su procedimiento:  Coordinador de [employer], [phone], o Winona Legato, [phone], de 8 a.m. a 5 p.m., de lunes a viernes.  Para preguntas urgentes despus del horario laboral, los das festivos y los fines de [place], llame [facility]) al [phone] y pida hablar con el mdico de radiologa intervencionista de [place].    Para programar o [place]  y horas del procedimiento o las citas clnicas:  Programacin de Radiologa Intervencionista, de lunes a viernes: [phone].    Para programar radiografas, ultrasonidos (ecografas), exploraciones TAC o estudios de RM:  Programacin de Radiologa Intervencionista, de lunes a viernes: [phone].    Los resultados de pruebas de laboratorio u otros documentos importantes pueden enviarse por fax a:   [phone] [facility]), [phone] Winona Legato).

## 2016-08-18 NOTE — Progress Notes (Signed)
I have reviewed the relevant imaging studies and laboratory data as well as the clinical situation, interviewed and examined the patient with the [place] Nurse Practitioner, and agree with the findings and management plan as described above.  Time spent with patient was 45 minutes, the majority spent counseling the patient and her daughter via MARTTI on the status of her large symptomatic HCC, therapeutic options, and the recommended TABE.  We will treat her [facility] 08/28/2016 with planned double dose of TABE.  I anticipate that she will need multiple treatments, given the tumor size (14 cms).    Electronically signed by Dr. Revonda Standard. Gilda Crease Attending PID# [phone]

## 2016-08-18 NOTE — Progress Notes (Signed)
Vascular and Interventional Radiology Clinic Note    Date: August 18, 2016   Patient Name: Laurie [name]   Medical Record #: [id number]   DOB: April 08, 1966, Age: 51 year old  Sex: female    Requesting MD: Patriciaann Clan    History of Present Illness:     Laurie [name] is a 51 year old female with a history of no previous h/o cirrhosis or chronic liver disease who was recently diagnosed with hepatocellular carcinoma and presents to [facility] with her daughter for consideration of possible treatment options.  Per Dr. Whitney Muse note from 7/34: newly diagnosed [facility] after presenting to local her emergency department with a 5 day history of dark urine, yellow eyes and itchiness 06/2016.  Liver MRI 07/09/16 showed 11.3 cm x 8.0 cm mass in the R lobe of the liver. Surgical pathology of liver lesion consistent with moderately-differentiated hepatocellular carcinoma ([place]).  She is not a candidate for liver resection or liver transplantation due to the size of the tumor.    Pt endorses that she feels occasional chills and shakes related to feeling cold easily, but has not had any fevers.  Pt has not had any recent history of yellow eyes or itchiness.  She also denies confusion, fevers, weakness, fatigue, chest pain, SOB, abdominal pain or distention, N/V/D, hemoptysis, hematochezia, melena or edema.     MARTTI Spanish interpreter was used for the visit.     ALLERGIES:No Known Allergies  Current Outpatient Prescriptions   Medication Sig    acetaminophen (TYLENOL) 325 MG tablet Take 2 tablets (650 mg) by mouth every 6 hours as needed (pain).    glimepiride (AMARYL) 4 MG tablet Take 4 mg by mouth every morning.    losartan (COZAAR) 25 MG tablet Take 25 mg by mouth daily.    metFORMIN (GLUCOPHAGE) 1000 MG tablet Take 1,000 mg by mouth 2 times daily (with meals).    omeprazole (PRILOSEC) 20 MG capsule Take 20 mg by mouth daily.    ondansetron (ZOFRAN) 8 MG tablet Take 1 tablet (8 mg) by mouth every 8 hours as  needed for Nausea/Vomiting.    oxyCODONE (ROXICODONE) 5 MG immediate release tablet Take 1 tablet (5 mg) by mouth every 4 hours as needed for Severe Pain (Pain Score 7-10).    pioglitazone (ACTOS) 30 MG tablet Take 30 mg by mouth daily.     No current facility-administered medications for this visit.        Past Medical History:   Diagnosis Date    DM II (diabetes mellitus, type II), controlled (CMS-HCC) 07/23/2016    GERD (gastroesophageal reflux disease)     Hepatocellular carcinoma (CMS-HCC)     Hypertension        Review of Systems:  Review of Systems - As per HPI, otherwise negative.    Physical Exam  BP 155/80 (BP Location: Right arm, BP Patient Position: Sitting, BP cuff size: Regular)   Pulse 97   Temp 97.7 F (36.5 C) (Temporal Artery)   Resp 16   Wt 88 kg (193 lb 14.4 oz)   BMI 39.16 kg/m2    GENERAL: well nourished, obese in NAD, answers appropriately.   HEENT:  EOMI, anicteric sclerae, oropharynx clear  CHEST: RRR; LS CTAB, normal respiratory rate  ABDOMEN: Soft, large round abdomen, non-distended, NTTP, +BS  EXTREMITIES: No asterixis, No edema    Laboratory data:   Lab Results   Component Value Date    INR 1.3 08/12/2016  PTT 37 08/12/2016     Lab Results   Component Value Date    WBC 9.8 08/12/2016    HGB 10.4 08/12/2016    HCT 33.2 08/12/2016    PLT 682 08/12/2016    LYMPHS 14 08/12/2016     Lab Results   Component Value Date    NA 138 08/12/2016    K 4.2 08/12/2016    CL 98 08/12/2016    BICARB 24 08/12/2016    BUN 9 08/12/2016    CREAT 0.37 08/12/2016    GLU 145 08/12/2016     9.9 08/12/2016     Lab Results   Component Value Date    AST 62 08/12/2016    ALT 54 08/12/2016    ALK 599 08/12/2016    TBILI 2.23 08/12/2016    DBILI 1.3 07/28/2016    TP 8.4 08/12/2016    ALB 3.1 08/12/2016       Imaging:   MRI SECOND READ OUTSIDE FILM 07/09/16:        LIVER/BILIARY: No hepatic surface nodularity, suggesting absence of cirrhosis. There is a large mildly, mildly T2 hyperintense lesion centered in the  right hepatic lobe extending into the medial segment of the left hepatic lobe. The lesion demonstrates internal signal dropout on opposed phase imaging suggesting internal fat and measures approximately 11.7 x 10.2 x 12.8 cm (series 12, image 43). Heterogeneous arterial phase hyper enhancement and prompt washout on portal venous and delayed phases. There is a serpentine region of tubular T2 hyperintensity along the superior aspect of this lesion in hepatic segment 8 (series 3, image 31) likely representing focal biliary ductal dilation. Mild intrahepatic biliary ductal dilation along the left hepatic lobe (series 3, image 23). Common bile duct has a normal caliber. The lesion encases the left and right portal veins and appears to encase the main portal vein at the bifurcation. The extrahepatic portion of the main portal vein is not   encased and measures approximately 12 mm in diameter. The lesion compresses the IVC and displaces the left and right hepatic veins. Middle hepatic vein is not well visualized and may be compressed. The portal vein is patent at the hilum. Superior mesenteric and splenic veins are unremarkable. Gallbladder is not visualized.    Assessment and Plan:  In summary, this patient is a 51 year old female with biopsy proven HCC with large tumor who presented to [facility] clinic today to discuss transarterial chemoembolization.  The risks, benefits and alternatives of the planned procedure, including bleeding, infection, liver failure, organ damage, vessel damage, and allergy/reaction to contrast and other medications have been discussed with the patient and/or her legal representative, all questions have been answered and they agree to proceed.    1. Plan for TABE #1 on March 26 at 0900.  2. NPO except meds after midnight prior per moderate sedation protocol.  3. Hold Metformin and all other anti diabetic medications on the morning of the procedure.    No orders of the defined types were placed in this  encounter.      CONSENT OBTAINED IN CLINIC:  YES   Discussed case with VIR attending: Dr. Kalman Shan.    Bennye Alm, NP  Vascular Interventional Radiology

## 2016-08-20 ENCOUNTER — Ambulatory Visit
Admission: RE | Admit: 2016-08-20 | Discharge: 2016-08-20 | Disposition: A | Discharge status: Home / Self Care | Payer: Self-pay | Attending: Gastroenterology | Admitting: Gastroenterology

## 2016-08-20 ENCOUNTER — Encounter (HOSPITAL_BASED_OUTPATIENT_CLINIC_OR_DEPARTMENT_OTHER): Payer: Self-pay

## 2016-08-20 ENCOUNTER — Encounter (HOSPITAL_BASED_OUTPATIENT_CLINIC_OR_DEPARTMENT_OTHER): Payer: Self-pay | Admitting: Gastroenterology

## 2016-08-20 ENCOUNTER — Encounter (HOSPITAL_BASED_OUTPATIENT_CLINIC_OR_DEPARTMENT_OTHER): Admission: RE | Disposition: A | Discharge status: Home Health | Payer: Self-pay | Attending: Gastroenterology

## 2016-08-20 DIAGNOSIS — B9681 Helicobacter pylori [H. pylori] as the cause of diseases classified elsewhere: Secondary | ICD-10-CM | POA: Insufficient documentation

## 2016-08-20 DIAGNOSIS — K2950 Unspecified chronic gastritis without bleeding: Secondary | ICD-10-CM

## 2016-08-20 DIAGNOSIS — Z7982 Long term (current) use of aspirin: Secondary | ICD-10-CM | POA: Insufficient documentation

## 2016-08-20 DIAGNOSIS — K648 Other hemorrhoids: Secondary | ICD-10-CM | POA: Insufficient documentation

## 2016-08-20 DIAGNOSIS — C799 Secondary malignant neoplasm of unspecified site: Secondary | ICD-10-CM

## 2016-08-20 DIAGNOSIS — K219 Gastro-esophageal reflux disease without esophagitis: Secondary | ICD-10-CM | POA: Insufficient documentation

## 2016-08-20 DIAGNOSIS — D649 Anemia, unspecified: Secondary | ICD-10-CM | POA: Insufficient documentation

## 2016-08-20 DIAGNOSIS — C220 Liver cell carcinoma: Secondary | ICD-10-CM | POA: Insufficient documentation

## 2016-08-20 DIAGNOSIS — I10 Essential (primary) hypertension: Secondary | ICD-10-CM | POA: Insufficient documentation

## 2016-08-20 DIAGNOSIS — K2970 Gastritis, unspecified, without bleeding: Principal | ICD-10-CM | POA: Insufficient documentation

## 2016-08-20 DIAGNOSIS — Z7984 Long term (current) use of oral hypoglycemic drugs: Secondary | ICD-10-CM | POA: Insufficient documentation

## 2016-08-20 DIAGNOSIS — E119 Type 2 diabetes mellitus without complications: Secondary | ICD-10-CM | POA: Insufficient documentation

## 2016-08-20 LAB — GLUCOSE (POCT): Glucose (POCT): 126 mg/dL — ABNORMAL HIGH (ref 70–99)

## 2016-08-20 SURGERY — ESOPHAGOGASTRODUODENOSCOPY (EGD) AND COLONOSCOPY
Anesthesia: Moderate Sedation - by non-anesthesia staff only

## 2016-08-20 MED ORDER — MIDAZOLAM HCL 5 MG/5ML IJ SOLN
INTRAMUSCULAR | Status: DC | PRN
Start: 2016-08-20 — End: 2016-08-20
  Administered 2016-08-20: 1 mg via INTRAVENOUS
  Administered 2016-08-20: 2 mg via INTRAVENOUS
  Administered 2016-08-20 (×5): 1 mg via INTRAVENOUS

## 2016-08-20 MED ORDER — FENTANYL CITRATE (PF) 100 MCG/2ML IJ SOLN
INTRAMUSCULAR | Status: DC | PRN
Start: 2016-08-20 — End: 2016-08-20
  Administered 2016-08-20 (×6): 25 ug via INTRAVENOUS
  Administered 2016-08-20: 50 ug via INTRAVENOUS

## 2016-08-20 MED ORDER — SODIUM CHLORIDE 0.9 % IV SOLN
INTRAVENOUS | Status: DC | PRN
Start: 2016-08-20 — End: 2016-08-20
  Administered 2016-08-20: 500 mL via INTRAVENOUS

## 2016-08-20 SURGICAL SUPPLY — 1 items: BIOPSY FORCEP RADIAL JAW 4 2.8MM X 240CM, LARGE W/NEEDLE (Misc Medical Supply) ×2 IMPLANT

## 2016-08-20 NOTE — Procedures (Signed)
Patient Name: Laurie [name]   MR#: [id number]   Date/Time ofProcedure: 08/20/2016 12:00:00 PM 08:00 AM   Endoscopist: Daneil Dan     GI Fellow: None     Referring Physician:      PROCEDURE PERFORMED: COLONOSCOPY     INDICATIONS FOR EXAMINATION: Laurie [name]; Anemia; r/o Luminal primary     INSTRUMENTS:      MEDICATIONS: Total for both procedures: Versed 8 mg IVP,   Fentanyl 200 mcg IVP'; total sedation time: 52 min;   NEED FOR ANESTHESIA:No    The attending physician, Dr.Keirstyn Aydt Cameron Sprang, was present for the   entire examination.     PROCEDURE TECHNIQUE: A physical exam was performed. Informed   consent was obtained from the patient after explaining all the   risks (perforation, bleeding, infection, missed lesion(s), and   adverse effects to the medicine), benefits and alternatives to   the procedure which the patient appeared to understand and so   stated.  The patient was connected to the monitoring devices and   placed in the left lateral position. Continuous oxygen was   provided with a nasal cannula and IV medicine administered   through an indwelling cannula. After adequate moderate sedation   was achieved, a digital exam was performed and the colonoscope   was introduced into the rectum and advanced under direct   visualization to the extent of exam. The scope was subsequently   removed slowly while carefully examining the color, texture,   anatomy, and integrity of the mucosa on the way out. In the   rectum, the scope was retroflexed to evaluate for internal   hemorrhoids and anorectal pathology. The patient was   subsequently transferred to the recovery area in satisfactory   condition.      COMPLICATIONS: None   ESTIMATED BLOOD LOSS: None   BIOPSY TAKEN: No   BOWEL PREP QUALITY: good   EXTENT OF EXAM: cecum     Findings: Mild internal and external hemorrhoids in the rectum,   seen upon retroflexion  Tortuous left colon  Exam otherwise   normal     Endoscopic Diagnosis: Mild internal and external hemorrhoids in   the  rectum, seen upon retroflexion  Tortuous left colon  Exam   otherwise normal     Recommendations: 1. Discharge home  2. Proceed with IR   treatment for [facility] as planned earlier      Electronically signed by By Daneil Dan, , PID: [id number]   on 08/20/2016 9:49:33 AM

## 2016-08-20 NOTE — Discharge Instructions (Signed)
Discharge instructions given to patient. See [employer] report. Patient verbalized understanding. All belongings sent home with patient.

## 2016-08-20 NOTE — H&P (Signed)
History and Physical    Indication for procedure:  Liver Mass; r/o luminal primary    Pain Score: 0          Past Medical History:   Diagnosis Date    DM II (diabetes mellitus, type II), controlled (CMS-HCC) 07/23/2016    GERD (gastroesophageal reflux disease)     Hepatocellular carcinoma (CMS-HCC)     Hypertension      Past Surgical History:   Procedure Laterality Date    CESAREAN SECTION, LOW TRANSVERSE      times 3    CHOLECYSTECTOMY  2015     No Known Allergies  Prior to Admission Medications   Prescriptions Last Dose Informant Patient Reported? Taking?   acetaminophen (TYLENOL) 325 MG tablet   No No   Sig: Take 2 tablets (650 mg) by mouth every 6 hours as needed (pain).   glimepiride (AMARYL) 4 MG tablet 08/19/2016 at Unknown time  Yes Yes   Sig: Take 4 mg by mouth every morning.   losartan (COZAAR) 25 MG tablet   Yes No   Sig: Take 25 mg by mouth daily.   metFORMIN (GLUCOPHAGE) 1000 MG tablet 08/19/2016 at Unknown time  Yes Yes   Sig: Take 1,000 mg by mouth 2 times daily (with meals).   omeprazole (PRILOSEC) 20 MG capsule 08/19/2016 at Unknown time  Yes Yes   Sig: Take 20 mg by mouth daily.   ondansetron (ZOFRAN) 8 MG tablet   No No   Sig: Take 1 tablet (8 mg) by mouth every 8 hours as needed for Nausea/Vomiting.   oxyCODONE (ROXICODONE) 5 MG immediate release tablet   No No   Sig: Take 1 tablet (5 mg) by mouth every 4 hours as needed for Severe Pain (Pain Score 7-10).   pioglitazone (ACTOS) 30 MG tablet 08/19/2016 at Unknown time  Yes Yes   Sig: Take 30 mg by mouth daily.      Facility-Administered Medications: None       BP 122/69   Pulse 103   Temp 98.4 F (36.9 C)   Resp 18   Ht 4\' 11"  (1.499 m)   Wt 87.5 kg (193 lb)   SpO2 100%   BMI 38.98 kg/m2  General: Well developed, well nourished, in no apparent distress.  Lungs: Clear breath sounds bilaterally.  CV: Normal rate, regular rhythm, no significant murmur present.  Abdomen: Soft, nontender, normal bowel sounds present.    ASA Score:  3   Airway ([place])  Score:  Class II - Soft palate, uvula, and fauces are visible.    Assessment and Plan  Proceed to planned procedure.    The patient has consented to the procedure, which will be done with sedation.  I have assessed the patient's status immediately prior to this procedure.  I have discussed pain management needs and options for the patient with the patient or caregiver.      The patient agrees to be full code for the duration of the procedure.    Sedation options, risks, and plans have been discussed with the patient or caregiver.  Questions were answered.  The patient or caregiver agrees to proceed as planned.    Laurie [name]

## 2016-08-20 NOTE — Procedures (Signed)
Patient Name: Laurie [name]   MR#: [id number]   Date/Time ofProcedure: 08/20/2016 12:00:00 PM 08:00 AM   Endoscopist: Daneil Dan     GI Fellow: None     Referring Physician:      PROCEDURE PERFORMED: EGD     INDICATIONS FOR EXAMINATION: [place]; Anemia; r/o Luminal primary     INSTRUMENTS:      MEDICATIONS: Versed 5 mg IVP, Fentanyl 125 mcg IVP, Lidocaine   Gel 2%/5 mL   NEED FOR ANESTHESIA:No    The attending physician, Dr.Kiearra Oyervides Cameron Sprang, was present for the   entire examination.     PROCEDURE TECHNIQUE: A evaluation was performed. Informed   consent was obtained from the patient after explaining all the   risks (perforation, bleeding, infection, adverse effects to the   medicine, missed lesion(s), and tooth damage), benefits and   alternatives to the procedure which the patient appeared to   understand and so stated.  The patient was connected to the   monitoring devices and placed in the left lateral position.   Continuous oxygen was provided with a nasal cannula and IV   medicine administered through a indwelling cannula. After   adequate conscious sedation was achieved, the patient was   intubated and the scope advanced under direct visualization to   extent of exam.  The scope was subsequently removed slowly while   carefully examining the color, texture, anatomy, and integrity   of the mucosa on the way out. The patient was subsequently   transferred to the recovery area in satisfactory condition.       COMPLICATIONS: None   ESTIMATED BLOOD LOSS: None   BIOPSY TAKEN: Yes   BOWEL PREP QUALITY:    EXTENT OF EXAM: second part of duodenum     Findings: No Varices seen  Z-line regular at approx. 38 cm from   central incisors.  Stomach with mild, diffuse erythema and   altered texture. Biopsies obtained in the gastric antrum and   body to r/o H.pylori. Normal retroflexion in the cardia and   fundus  Normal examined portion of duodenum      Endoscopic Diagnosis: No Varices seen  No mass  Z-line regular   at approx. 38 cm  from central incisors.  Stomach with mild,   diffuse erythema and altered texture. Biopsies obtained in the   gastric antrum and body to r/o H.pylori. Normal retroflexion in   the cardia and fundus  Normal examined portion of duodenum      Recommendations: 1. Proceed with Colonoscopy  2. Await   pathology results  3. Avoid NSAIDs      Electronically signed by By Daneil Dan, , PID: [id number]   on 08/20/2016 9:15:59 AM

## 2016-08-20 NOTE — RN OR/Procedure Note (Signed)
Marti interpreter # [phone] Dayana interpreted for patient and Dr. Cameron Sprang in the presence of patient's daughter and niece with her permission.

## 2016-08-24 NOTE — Procedures (Addendum)
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       -No intestinal metaplasia or dysplasia identified.       -Positive for focal H. pylori organisms on routine stain.    SPECIMEN(S) SUBMITTED:  A: gastric biopsy, rule out H. pylori    CLINICAL HISTORY:  Liver mass, rule out luminal primary.    GROSS DESCRIPTION:  A: The specimen (received in formalin, labeled with the patient's name,  medical record number and "gastric antrum bx") consists of multiple  fragments of tan soft tissue ranging from 0.2 to 0.5 cm in greatest  dimension. The specimen is stained with eosin and entirely submitted in  cassettes A1 and A2.  JH/jb  CONFIDENTIAL HEALTH INFORMATION: Health Care information is personal and  sensitive information. If it is being faxed to you it is done so under  appropriate authorization from the patient or under circumstances that do  not require patient authorization. You, the recipient, are obligated to  maintain it in a safe, secure and confidential manner. Re-disclosure  without additional patient consent or as permitted by law is prohibited.  Unauthorized re-disclosure or failure to maintain confidentiality could  subject you to penalties described in federal and state law.  If you have  received this report or facsimile in error, please notify the [facility] immediately and destroy the received document(s).    Material reviewed and Interpreted and  Report Electronically Signed by:  Celene Squibb. Ponsford Tipps M.D. [phone])  Attending Surgical Pathologist  08/24/16 12:37  Electronic Signature derived from a single  controlled access password    Recs:  1. She will need H.pylori eradication therapy. But amoxicillin-based therapy can cause cholestasis and confuse the ongoing clinical picture with her [place] and liver disease. Would prioritize [place] treatment with TABE and discuss H.pylori  treatment at clinic f/u. Explained to patient's daughter over phone today.

## 2016-08-26 ENCOUNTER — Other Ambulatory Visit (INDEPENDENT_AMBULATORY_CARE_PROVIDER_SITE_OTHER): Payer: Self-pay

## 2016-08-28 ENCOUNTER — Ambulatory Visit (HOSPITAL_BASED_OUTPATIENT_CLINIC_OR_DEPARTMENT_OTHER): Payer: Self-pay

## 2016-08-31 ENCOUNTER — Telehealth (HOSPITAL_BASED_OUTPATIENT_CLINIC_OR_DEPARTMENT_OTHER): Payer: Self-pay | Admitting: Gastroenterology

## 2016-08-31 ENCOUNTER — Telehealth (HOSPITAL_BASED_OUTPATIENT_CLINIC_OR_DEPARTMENT_OTHER): Payer: Self-pay

## 2016-08-31 NOTE — Telephone Encounter (Signed)
51 yo female with [place], pos H.Pylori on EGD path 08/20/16, last seen by Dr Cameron Sprang 08/05/16, f/u 09/18/16.    Per TC, pt's daughter, [name]:    Marga Hoots  08/31/16       Pt's daughter, [name], called to inform that they are ready for antibiotics. Was informed of pathology results last week but was not ready to start medication. Pt now ready. Please advise        Dr Cameron Sprang- please advise. I am presuming she is inquiring  re: H. Pylori pos result.  No documentation noted and no antibiotics ordered.    The only Phar seen in her chart is [employer].

## 2016-08-31 NOTE — Telephone Encounter (Signed)
Pt's daughter, [name], called to inform that they are ready for antibiotics. Was informed of pathology results last week but was not ready to start medication. Pt now ready. Please advise.

## 2016-08-31 NOTE — Telephone Encounter (Signed)
[facility]     Telephone call received from family friend, Kara Melching Fanjul. Ms. Fanjul reports that pt and family are aware that pt was denied charity care for her oncology treatment at [facility], and she is calling on pt's behalf to inquire about options for low/no cost cancer treatment. This clinician acknowledged and validated the difficulty of obtaining oncology treatment without medical insurance. Discussed the option of purchasing private insurance and clarified that one must be a [place] resident to [employer] through the [employer]. Explained that there are no programs available in [facility] that provide free allopathic oncology treatment. Ms. Brynda Peon expressed understanding and appreciation for the information.     This clinician will remain available to provide psychosocial support as needed.     Neysa Bonito, [place] [facility]  [phone]

## 2016-09-03 ENCOUNTER — Ambulatory Visit (HOSPITAL_BASED_OUTPATIENT_CLINIC_OR_DEPARTMENT_OTHER): Payer: MEDICAID | Admitting: Transplant Hepatology

## 2016-09-05 NOTE — Telephone Encounter (Signed)
Error. Close encounter

## 2016-09-07 ENCOUNTER — Telehealth (HOSPITAL_BASED_OUTPATIENT_CLINIC_OR_DEPARTMENT_OTHER): Payer: Self-pay | Admitting: Gastroenterology

## 2016-09-07 NOTE — Telephone Encounter (Signed)
Spoke with [employer]

## 2016-09-07 NOTE — Telephone Encounter (Signed)
Spoke to [employer] from [place] [facility] and her charity case with [facility] has been denied. Apt with [name] will not be approve for charity case either.

## 2016-09-09 ENCOUNTER — Encounter (HOSPITAL_BASED_OUTPATIENT_CLINIC_OR_DEPARTMENT_OTHER): Payer: Self-pay | Admitting: Hematology & Oncology

## 2016-09-10 ENCOUNTER — Ambulatory Visit (HOSPITAL_BASED_OUTPATIENT_CLINIC_OR_DEPARTMENT_OTHER): Payer: Self-pay

## 2016-09-11 ENCOUNTER — Encounter (HOSPITAL_BASED_OUTPATIENT_CLINIC_OR_DEPARTMENT_OTHER): Payer: MEDICAID | Admitting: Hematology & Oncology

## 2016-09-14 ENCOUNTER — Emergency Department (HOSPITAL_BASED_OUTPATIENT_CLINIC_OR_DEPARTMENT_OTHER): Payer: Self-pay

## 2016-09-14 ENCOUNTER — Encounter (HOSPITAL_BASED_OUTPATIENT_CLINIC_OR_DEPARTMENT_OTHER): Payer: Self-pay | Admitting: Physician Assistant

## 2016-09-14 ENCOUNTER — Other Ambulatory Visit (INDEPENDENT_AMBULATORY_CARE_PROVIDER_SITE_OTHER): Payer: Self-pay

## 2016-09-14 ENCOUNTER — Inpatient Hospital Stay
Admission: EM | Admit: 2016-09-14 | Discharge: 2016-09-15 | DRG: 948 | Disposition: A | Discharge status: Home / Self Care | Payer: Self-pay | Attending: Internal Medicine | Admitting: Internal Medicine

## 2016-09-14 ENCOUNTER — Inpatient Hospital Stay (HOSPITAL_COMMUNITY): Payer: Self-pay

## 2016-09-14 DIAGNOSIS — R1011 Right upper quadrant pain: Secondary | ICD-10-CM

## 2016-09-14 DIAGNOSIS — K219 Gastro-esophageal reflux disease without esophagitis: Secondary | ICD-10-CM | POA: Diagnosis present

## 2016-09-14 DIAGNOSIS — R17 Unspecified jaundice: Secondary | ICD-10-CM

## 2016-09-14 DIAGNOSIS — Z79899 Other long term (current) drug therapy: Secondary | ICD-10-CM

## 2016-09-14 DIAGNOSIS — R918 Other nonspecific abnormal finding of lung field: Secondary | ICD-10-CM

## 2016-09-14 DIAGNOSIS — G893 Neoplasm related pain (acute) (chronic): Principal | ICD-10-CM | POA: Diagnosis present

## 2016-09-14 DIAGNOSIS — C220 Liver cell carcinoma: Secondary | ICD-10-CM

## 2016-09-14 DIAGNOSIS — R109 Unspecified abdominal pain: Secondary | ICD-10-CM | POA: Diagnosis present

## 2016-09-14 DIAGNOSIS — D72829 Elevated white blood cell count, unspecified: Secondary | ICD-10-CM | POA: Diagnosis present

## 2016-09-14 DIAGNOSIS — I10 Essential (primary) hypertension: Secondary | ICD-10-CM | POA: Diagnosis present

## 2016-09-14 DIAGNOSIS — Z8249 Family history of ischemic heart disease and other diseases of the circulatory system: Secondary | ICD-10-CM

## 2016-09-14 DIAGNOSIS — Z833 Family history of diabetes mellitus: Secondary | ICD-10-CM

## 2016-09-14 DIAGNOSIS — Z9049 Acquired absence of other specified parts of digestive tract: Secondary | ICD-10-CM

## 2016-09-14 DIAGNOSIS — F5101 Primary insomnia: Secondary | ICD-10-CM

## 2016-09-14 DIAGNOSIS — E119 Type 2 diabetes mellitus without complications: Secondary | ICD-10-CM | POA: Diagnosis present

## 2016-09-14 DIAGNOSIS — R590 Localized enlarged lymph nodes: Secondary | ICD-10-CM

## 2016-09-14 DIAGNOSIS — Z7984 Long term (current) use of oral hypoglycemic drugs: Secondary | ICD-10-CM

## 2016-09-14 DIAGNOSIS — R160 Hepatomegaly, not elsewhere classified: Secondary | ICD-10-CM

## 2016-09-14 DIAGNOSIS — M549 Dorsalgia, unspecified: Secondary | ICD-10-CM | POA: Diagnosis present

## 2016-09-14 LAB — URINALYSIS WITH CULTURE REFLEX, WHEN INDICATED
Blood: NEGATIVE
Leuk Esterase: NEGATIVE
Nitrite: NEGATIVE
Specific Gravity: >=1.03 — IN (ref 1.002–1.030)
Urobilinogen: NEGATIVE
pH: 6 (ref 5.0–8.0)

## 2016-09-14 LAB — MDIFF
Bands: 2 % (ref 0–15)
Immature Granulocytes Absolute Manual: 0.6 10*3/uL — ABNORMAL HIGH (ref 0.0–0.1)
Metamyelocytes: 2 %
Myelocytes: 2 %
Number of Cells Counted: 114
Plt Est: INCREASED

## 2016-09-14 LAB — BASIC METABOLIC PANEL, BLOOD
Anion Gap: 19 mmol/L — ABNORMAL HIGH (ref 7–15)
BUN: 9 mg/dL (ref 6–20)
Bicarbonate: 21 mmol/L — ABNORMAL LOW (ref 22–29)
Calcium: 9.6 mg/dL (ref 8.5–10.6)
Chloride: 94 mmol/L — ABNORMAL LOW (ref 98–107)
Creatinine: 0.43 mg/dL — ABNORMAL LOW (ref 0.51–0.95)
GFR: >60 mL/min
Glucose: 167 mg/dL — ABNORMAL HIGH (ref 70–99)
Potassium: 4.3 mmol/L (ref 3.5–5.1)
Sodium: 134 mmol/L — ABNORMAL LOW (ref 136–145)

## 2016-09-14 LAB — PROTHROMBIN TIME, BLOOD
INR: 1.4
PT,Patient: 14.4 s — ABNORMAL HIGH (ref 9.7–12.5)

## 2016-09-14 LAB — CBC WITH DIFF, BLOOD
ANC-Manual Mode: 12.6 10*3/uL — ABNORMAL HIGH (ref 1.6–7.0)
Abs Basophils: 0.2 10*3/uL — ABNORMAL HIGH (ref <0.1)
Abs Lymphs: 1.4 10*3/uL (ref 0.8–3.1)
Abs Monos: 0.5 10*3/uL (ref 0.2–0.8)
Absolute Nucleated RBC: 0.2 10*3/uL — ABNORMAL HIGH (ref <0.1)
Basophils: 1 %
Hct: 32.8 % — ABNORMAL LOW (ref 34.0–45.0)
Hgb: 9.8 gm/dL — ABNORMAL LOW (ref 11.2–15.7)
Lymphocytes: 9 %
MCH: 26.5 pg (ref 26.0–32.0)
MCHC: 29.9 g/dL — ABNORMAL LOW (ref 32.0–36.0)
MCV: 88.6 um3 (ref 79.0–95.0)
MPV: 9.8 fL (ref 9.4–12.4)
Monocytes: 3 %
NRBC: 1 /100 WBC (ref <1)
Plt Count: 682 10*3/uL — ABNORMAL HIGH (ref 140–370)
RBC: 3.7 10*6/uL — ABNORMAL LOW (ref 3.90–5.20)
RDW: 20.4 % — ABNORMAL HIGH (ref 12.0–14.0)
Segs: 81 %
WBC: 15.2 10*3/uL — ABNORMAL HIGH (ref 4.0–10.0)

## 2016-09-14 LAB — LACTATE, BLOOD
Lactate: 2.3 mmol/L — ABNORMAL HIGH (ref 0.5–2.0)
Lactate: 1.8 mmol/L (ref 0.5–2.0)

## 2016-09-14 LAB — LIPASE, BLOOD: Lipase: 33 U/L (ref 13–60)

## 2016-09-14 LAB — PROCALCITONIN, BLOOD: Procalcitonin: 0.35 ng/mL — AB (ref <0.08)

## 2016-09-14 LAB — LIVER PANEL, BLOOD
ALT (SGPT): 77 U/L — ABNORMAL HIGH (ref 0–33)
AST (SGOT): 156 U/L — ABNORMAL HIGH (ref 0–32)
Albumin: 2.8 g/dL — ABNORMAL LOW (ref 3.5–5.2)
Alkaline Phos: 1418 U/L — ABNORMAL HIGH (ref 35–140)
Bilirubin, Dir: 4.9 mg/dL — ABNORMAL HIGH (ref <0.2)
Bilirubin, Tot: 6.36 mg/dL — ABNORMAL HIGH (ref <1.2)
Total Protein: 8.4 g/dL — ABNORMAL HIGH (ref 6.0–8.0)

## 2016-09-14 LAB — MAGNESIUM, BLOOD: Magnesium: 2 mg/dL (ref 1.6–2.6)

## 2016-09-14 MED ORDER — SODIUM CHLORIDE 0.9 % IJ SOLN (CUSTOM)
3.0000 mL | INTRAMUSCULAR | Status: DC | PRN
Start: 2016-09-14 — End: 2016-09-15

## 2016-09-14 MED ORDER — FENTANYL CITRATE (PF) 100 MCG/2ML IJ SOLN
50.0000 ug | Freq: Once | INTRAMUSCULAR | Status: AC
Start: 2016-09-14 — End: 2016-09-14
  Administered 2016-09-14: 50 ug via INTRAVENOUS
  Filled 2016-09-14: qty 2

## 2016-09-14 MED ORDER — OXYCODONE HCL 5 MG OR TABS
5.0000 mg | ORAL_TABLET | ORAL | Status: DC | PRN
Start: 2016-09-14 — End: 2016-09-15

## 2016-09-14 MED ORDER — SODIUM CHLORIDE 0.9% TKO INFUSION
INTRAVENOUS | Status: DC | PRN
Start: 2016-09-14 — End: 2016-09-15

## 2016-09-14 MED ORDER — POLYETHYLENE GLYCOL 3350 OR PACK
17.0000 g | PACK | Freq: Every day | ORAL | Status: DC
Start: 2016-09-15 — End: 2016-09-15
  Administered 2016-09-15: 17 g via ORAL
  Filled 2016-09-14: qty 1

## 2016-09-14 MED ORDER — DOCUSATE SODIUM 250 MG OR CAPS
250.0000 mg | ORAL_CAPSULE | Freq: Every evening | ORAL | Status: DC
Start: 2016-09-15 — End: 2016-09-15

## 2016-09-14 MED ORDER — SODIUM CHLORIDE 0.9 % IJ SOLN (CUSTOM)
3.0000 mL | Freq: Three times a day (TID) | INTRAMUSCULAR | Status: DC
Start: 2016-09-15 — End: 2016-09-15
  Administered 2016-09-15 (×2): 3 mL via INTRAVENOUS

## 2016-09-14 MED ORDER — INSULIN LISPRO (HUMAN) 100 UNIT/ML SC SOLN (CUSTOM)
1.0000 [IU] | Freq: Four times a day (QID) | INTRAMUSCULAR | Status: DC
Start: 2016-09-15 — End: 2016-09-15
  Administered 2016-09-15: 1 [IU] via SUBCUTANEOUS
  Filled 2016-09-14: qty 1

## 2016-09-14 MED ORDER — NALOXONE HCL 0.4 MG/ML IJ SOLN
0.1000 mg | INTRAMUSCULAR | Status: DC | PRN
Start: 2016-09-14 — End: 2016-09-15

## 2016-09-14 MED ORDER — OXYCODONE HCL 5 MG OR TABS
10.0000 mg | ORAL_TABLET | ORAL | Status: DC | PRN
Start: 2016-09-14 — End: 2016-09-15
  Filled 2016-09-14: qty 2

## 2016-09-14 MED ORDER — GLUCAGON HCL (RDNA) 1 MG IJ SOLR
1.0000 mg | Freq: Once | INTRAMUSCULAR | Status: DC | PRN
Start: 2016-09-14 — End: 2016-09-15

## 2016-09-14 MED ORDER — DEXTROSE (DIABETIC USE) 40 % OR GEL
1.0000 | ORAL | Status: DC | PRN
Start: 2016-09-14 — End: 2016-09-15

## 2016-09-14 MED ORDER — DEXTROSE 50 % IV SOLN
12.5000 g | INTRAVENOUS | Status: DC | PRN
Start: 2016-09-14 — End: 2016-09-15

## 2016-09-14 MED ORDER — ONDANSETRON HCL 4 MG/2ML IV SOLN
4.0000 mg | Freq: Four times a day (QID) | INTRAMUSCULAR | Status: DC | PRN
Start: 2016-09-14 — End: 2016-09-15

## 2016-09-14 MED ORDER — IOHEXOL 350 MG/ML IV SOLN
100.0000 mL | Freq: Once | INTRAVENOUS | Status: AC
Start: 2016-09-14 — End: 2016-09-14
  Administered 2016-09-14: 100 mL via INTRAVENOUS
  Filled 2016-09-14: qty 100

## 2016-09-14 MED ORDER — LACTATED RINGERS IV SOLN
Freq: Once | INTRAVENOUS | Status: AC
Start: 2016-09-15 — End: 2016-09-15
  Administered 2016-09-15: 02:00:00 via INTRAVENOUS

## 2016-09-14 MED ORDER — GLUCOSE 4 GM PO CHEW (CUSTOM)
4.0000 | CHEWABLE_TABLET | ORAL | Status: DC | PRN
Start: 2016-09-14 — End: 2016-09-15

## 2016-09-14 MED ORDER — BISACODYL 10 MG RE SUPP
10.0000 mg | Freq: Every day | RECTAL | Status: DC | PRN
Start: 2016-09-14 — End: 2016-09-15

## 2016-09-14 MED ORDER — MAGNESIUM HYDROXIDE 400 MG/5ML OR SUSP
30.0000 mL | Freq: Every evening | ORAL | Status: DC | PRN
Start: 2016-09-14 — End: 2016-09-15

## 2016-09-14 MED ORDER — SENNA 8.6 MG OR TABS
2.0000 | ORAL_TABLET | Freq: Every morning | ORAL | Status: DC
Start: 2016-09-15 — End: 2016-09-15
  Administered 2016-09-15: 17.2 mg via ORAL
  Filled 2016-09-14: qty 2

## 2016-09-14 MED ORDER — ENOXAPARIN SODIUM 40 MG/0.4ML SC SOLN
40.0000 mg | Freq: Every day | SUBCUTANEOUS | Status: DC
Start: 2016-09-15 — End: 2016-09-15
  Administered 2016-09-15: 40 mg via SUBCUTANEOUS
  Filled 2016-09-14: qty 1

## 2016-09-14 MED ORDER — FENTANYL CITRATE (PF) 100 MCG/2ML IJ SOLN
25.0000 ug | Freq: Once | INTRAMUSCULAR | Status: AC
Start: 2016-09-14 — End: 2016-09-14
  Administered 2016-09-14: 25 ug via INTRAVENOUS
  Filled 2016-09-14: qty 2

## 2016-09-14 NOTE — ED Notes (Signed)
Verbal report given to [employer], [employer].  Pt to be edip rm 12

## 2016-09-14 NOTE — ED Notes (Signed)
Pt to CT scan at this time.

## 2016-09-14 NOTE — H&P (Signed)
MEDICINE SERVICE HISTORY AND PHYSICAL    Attending MD:   Holley Dexter, DO    Chief Complaint     Back pain x 3 days.      History of Present Illness   51 year old female with history of hypertension, diabetes and newly diagnosed hepatocellular carcinoma. Patient followed by Dr. Tenna Child here at [place]. Presents to the ED with complaint of back pain 3 days, she endorses decreased appetite. She describes it as 1/10, constant pain, burning sensation.  She localizes pain from LBP,  Upper back.  She denies trauma. She denies fevers, chills.  Occasional  night sweats. No chest pain or palpitation. No shortness of breath but c/o non productive cough x 3 weeks that has been improving last 2-3 days. No abdominal pain,  Nausea, vomiting or diarrhea. No pain during urination but endorses increase frequency x 2 months.  Tolerating food/fluid but endorses decrease appetite.       ED Course:  Pain control.  CT ab/pelvis.      Vitals on presentation:  Temperature 98.72F  Pulse 120  Respirations 18  Blood pressure 146/77  SPO2 95% on room air    Patient Background     Past Medical and Surgical History:  Past Medical History:   Diagnosis Date    DM II (diabetes mellitus, type II), controlled (CMS-HCC) 07/23/2016    GERD (gastroesophageal reflux disease)     Hepatocellular carcinoma (CMS-HCC)     Hypertension      Past Surgical History:   Procedure Laterality Date    CESAREAN SECTION, LOW TRANSVERSE      times 3    CHOLECYSTECTOMY  2015         Allergies:  No Known Allergies    Medications:  Prior to Admission Medications   Prescriptions Last Dose Informant Patient Reported? Taking?   acetaminophen (TYLENOL) 325 MG tablet 09/13/2016  No Yes   Sig: Take 2 tablets (650 mg) by mouth every 6 hours as needed (pain).   glimepiride (AMARYL) 4 MG tablet 09/13/2016  Yes Yes   Sig: Take 4 mg by mouth every morning.   losartan (COZAAR) 25 MG tablet 09/13/2016  Yes Yes   Sig: Take 25 mg by mouth daily.   metFORMIN (GLUCOPHAGE) 1000 MG tablet  09/13/2016  Yes Yes   Sig: Take 1,000 mg by mouth 2 times daily (with meals).   omeprazole (PRILOSEC) 20 MG capsule 09/13/2016  Yes Yes   Sig: Take 20 mg by mouth daily.   ondansetron (ZOFRAN) 8 MG tablet Past Month  No Yes   Sig: Take 1 tablet (8 mg) by mouth every 8 hours as needed for Nausea/Vomiting.   oxyCODONE (ROXICODONE) 5 MG immediate release tablet Over a Month  No No   Sig: Take 1 tablet (5 mg) by mouth every 4 hours as needed for Severe Pain (Pain Score 7-10).   pioglitazone (ACTOS) 30 MG tablet 09/13/2016  Yes Yes   Sig: Take 30 mg by mouth daily.      Facility-Administered Medications: None     Social History:  She reports that she has never smoked. She has never used smokeless tobacco.  She reports that she does not drink alcohol.  She reports that she does not use illicit drugs.    Family History:  Family History   Problem Relation Age of Onset    Hypertension Mother     Dementia Father     Diabetes Father     No Known Problems Daughter  No Known Problems Maternal Grandmother     No Known Problems Maternal Grandfather     No Known Problems Paternal Grandmother     No Known Problems Paternal Grandfather     Diabetes Sister     No Known Problems Brother     No Known Problems Daughter     No Known Problems Daughter     No Known Problems Brother     No Known Problems Brother     Diabetes Sister     No Known Problems Sister     No Known Problems Sister        Social History:  Social History     Social History    Marital status: Married     Spouse name: N/A    Number of children: N/A    Years of education: N/A     Occupational History    Not on file.     Social History Main Topics    Smoking status: Never Smoker    Smokeless tobacco: Never Used    Alcohol use No    Drug use: No    Sexual activity: Not on file     Other Topics Concern    Not on file     Social History Narrative    Married with 3 children.  TEFL teacher.        Review of Systems:  Review of Systems   Constitutional:  Positive for diaphoresis. Negative for chills and fever.   HENT: Negative for congestion, sinus pain and sore throat.    Eyes: Negative for pain and discharge.   Respiratory: Positive for cough and sputum production. Negative for shortness of breath and wheezing.    Cardiovascular: Negative for chest pain and palpitations.   Gastrointestinal: Negative for abdominal pain, nausea and vomiting.   Genitourinary: Positive for frequency. Negative for dysuria, hematuria and urgency.   Musculoskeletal: Negative for back pain and myalgias.   Neurological: Negative for speech change and headaches.       Remainder of a complete ROS reviewed and found to be negative.     Physical Exam       BP 143/70 (BP Location: Right arm, BP Patient Position: Supine)   Pulse 107   Temp 99.8 F (37.7 C)   Resp (!) 29   Ht 4\' 11"  (1.499 m)   Wt 86.7 kg (191 lb 1.6 oz)   SpO2 99%   BMI 38.6 kg/m2      GEN: WDWN, NAD, pleasant and cooperative.  Obese  Neuro: AOx3, CN 3-12 intact, strength 5/5 BUE/BLE  Eyes: sclera icteric, EOMI, PERRL  Mouth: moist mucosa, uvula midline  Neck:  Supple, No LAD  CVS: RRR, No M/R/G  Lungs: CTAB  No adventious BS x 4.   Abdomen: Normal BS.  Soft, ND/NT.  Neg rebound.  No CVA tenderness.  Extremities: BLE 2+ edema.  DP/PT 2+.  Vascular: cap refill < 2 sec.   Psych:  Normal affect, Normal speech  Skin: Intact.  Jaundice    Data     Recent Results (from the past 24 hour(s))   CBC w/ Diff [place]    Collection Time: 09/14/16  4:07 PM   Result Value Ref Range    WBC 15.2 (H) 4.0 - 10.0 1000/mm3    RBC 3.70 (L) 3.90 - 5.20 mill/mm3    Hgb 9.8 (L) 11.2 - 15.7 gm/dL    Hct 32.8 (L) 34.0 - 45.0 %    MCV 88.6 79.0 -  95.0 um3    MCH 26.5 26.0 - 32.0 pgm    MCHC 29.9 (L) 32.0 - 36.0 g/dL    RDW 20.4 (H) 12.0 - 14.0 %    MPV 9.8 9.4 - 12.4 fL    Plt Count 682 (H) 140 - 370 1000/mm3    Absolute Nucleated RBC 0.2 (H) <0.1 1000/mm3    NRBC 1 <1 /100 WBC    Segs 81 %    Lymphocytes 9 %    Monocytes 3 %    Basophils 1 %     ANC-Manual Mode 12.6 (H) 1.6 - 7.0 1000/mm3    Abs Lymphs 1.4 0.8 - 3.1 1000/mm3    Abs Monos 0.5 0.2 - 0.8 1000/mm3    Abs Basophils 0.2 (H) <0.1 1000/mm3    Diff Type Manual    Prothrombin Time, Blood Blue    Collection Time: 09/14/16  4:07 PM   Result Value Ref Range    PT,Patient 14.4 (H) 9.7 - 12.5 sec    INR 1.4    Basic Metabolic Panel, Blood Green Plasma Separator Tube    Collection Time: 09/14/16  4:07 PM   Result Value Ref Range    Glucose 167 (H) 70 - 99 mg/dL    BUN 9 6 - 20 mg/dL    Creatinine 0.43 (L) 0.51 - 0.95 mg/dL    GFR >60 mL/min    Sodium 134 (L) 136 - 145 mmol/L    Potassium 4.3 3.5 - 5.1 mmol/L    Chloride 94 (L) 98 - 107 mmol/L    Bicarbonate 21 (L) 22 - 29 mmol/L    Anion Gap 19 (H) 7 - 15 mmol/L    Calcium 9.6 8.5 - 10.6 mg/dL   Liver Panel, Blood Green Plasma Separator Tube    Collection Time: 09/14/16  4:07 PM   Result Value Ref Range    Total Protein 8.4 (H) 6.0 - 8.0 g/dL    Albumin 2.8 (L) 3.5 - 5.2 g/dL    Bilirubin, Dir 4.9 (H) <0.2 mg/dL    Bilirubin, Tot 6.36 (H) <1.2 mg/dL    AST (SGOT) 156 (H) 0 - 32 U/L    ALT (SGPT) 77 (H) 0 - 33 U/L    Alkaline Phos 1418 (H) 35 - 140 U/L   Magnesium, Blood Green Plasma Separator Tube    Collection Time: 09/14/16  4:07 PM   Result Value Ref Range    Magnesium 2.0 1.6 - 2.6 mg/dL   Lipase, Blood Green Plasma Separator Tube    Collection Time: 09/14/16  4:07 PM   Result Value Ref Range    Lipase 33 13 - 60 U/L   Urinalysis with Culture Reflex, when indicated    Collection Time: 09/14/16  4:07 PM   Result Value Ref Range    Type Not Specified     Color Amber Yellow    Appearance Slightly Hazy Clear    Specific Gravity >=1.030 (X) 1.002 - 1.030    pH 6.0 5.0 - 8.0    Protein 3+ (A) Negative    Glucose 1+ (A) Negative    Ketones 2+ (A) Negative    Bilirubin 3+ (A) Negative    Blood Negative Negative    Urobilinogen Negative Negative    Nitrite Negative Negative    Leuk Esterase Negative Negative    WBC 0-2 0-2/HPF    RBC 0-2 0-2/HPF    Bacteria  Rare None-Rare/HPF    Squam. Epithelial Cell 0-5(RARE) <6-10(FEW)  Mucus Rare None-Rare/HPF    Hyaline Cast 3-5 (A) 0-2/LPF    Granular Cast 0-2 (A) None/LPF   MDIFF    Collection Time: 09/14/16  4:07 PM   Result Value Ref Range    Bands 2 0 - 15 %    Metamyelocytes 2 %    Myelocytes 2 %    Number of Cells Counted 114     Immature Granulocytes Absolute Manual 0.6 (H) 0.0 - 0.1 1000/mm3    Plt Est Increased     RBC Comment Present     Macrocytic 2+     Polychromasia 3+    Procalcitonin    Collection Time: 09/14/16  4:07 PM   Result Value Ref Range    Procalcitonin 0.35 (A) <0.08 ng/mL   Blood Culture Routine Blood Culture Set    Collection Time: 09/14/16  5:25 PM   Result Value Ref Range    Blood Culture Result Culture in progress.    Blood Culture Routine Blood Culture Set    Collection Time: 09/14/16  5:31 PM   Result Value Ref Range    Blood Culture Result Culture in progress.    Lactate, Blood - See Instructions    Collection Time: 09/14/16  7:20 PM   Result Value Ref Range    Lactate 2.3 (H) 0.5 - 2.0 mmol/L   Lactate, Blood - See Instructions    Collection Time: 09/14/16 10:14 PM   Result Value Ref Range    Lactate 1.8 0.5 - 2.0 mmol/L         Lab Results   Component Value Date    A1C 7.3 07/09/2016           Imaging  X-ray Chest Single View Frontal    Result Date: 09/14/2016  IMPRESSION: Patchy opacity in the right upper lung may represent developing pneumonia versus atelectasis in the appropriate context.     Ct Chest With Contrast    Result Date: 09/14/2016  IMPRESSION: Multiple new intraparenchymal ground-glass nodular opacities in the right left lung, measuring up to 6 mm. These are concerning for metastases, especially given short interval growth. The previously seen right upper lobe opacities were not reproduced on CT evaluation. Please refer to concurrently performed CT abdomen/pelvis for findings below the diaphragm.     Ct Abdomen And Pelvis With Contrast    Result Date: 09/14/2016  IMPRESSION: CT scan of  the abdomen and pelvis with IV contrast 17 cm mass involving the right lobe of the liver and segment 4 consistent with known hepatocellular carcinoma which significantly encases and displaces the portal veins, hepatic veins and IVC but no evidence of thrombus at this time. The mass has increased in size from 11 cm on the prior CT. Small amount of adjacent ascites. Small amount of perihepatic lymphadenopathy. This mass likely accounts for the patient's right flank pain.         Assessment and Care Plan   51 yo female with h/o HTN, DM T2, HCC. Presents to the ED with increasing right-sided back pain 3 days.    #Leukocytosis-WBC 15.  Pt is afebrile but tachycardic.  Procalcitonin is elevated 0.35.  Bedside US showed no ascites.  Unknown source.  Hold antibiotics for now.    -Follow up blood culture.     # back pain likely referred from [facility] cancer progression.    -Continue home regimen of roxicodone 5 mg q 4 hours prn for moderate, 10 mg q 4 hours prn severe pain.  With  5 mg q 4 hrs prn breakthrough pain.      #HCC-CT as above.  Pt did not start chemo pending insurance approval.    #transaminitis  #elevated alkaline phos  -Consult hepatology.      #HTN normotensive  -Hold home Cozaar    #DM T2 last hemoglobin A1c 7.3 (07/09/2016)  -HA1c  -hold home oral medications start SSI low correction.      FEN: Diabetic, low-salt diet.    -Nutrion consult    DVT prophylaxis: Lovenox    Code Status:  Full code/full care    Charna Busman, PA    09/14/16, 9:50 PM    ATTENDING HISTORY AND PHYSICAL ATTESTATION    Subjective    Chief complaint:  Back pain x 3 days.        History of present illness:  51 yo female with h/o HTN, DM T2, HCC. Presents to the ED with increasing right-sided back pain 3 days.    See resident history and physical for further details of the patient's history.    Objective    I have examined the patient and concur with the resident exam.    Assessment and Plan    I agree with the resident care plan.    See  the resident and physical for further details.    I spent 45 minutes of time on the patient care unit reviewing the patient's chart, examining the patient, communicating with the patient's care providers and/or family members and reviewing diagnostic studies.

## 2016-09-14 NOTE — ED Notes (Signed)
Dr Nonnie Done at bedside discussing [place].  Pt to be admitted to hospital.

## 2016-09-14 NOTE — ED Notes (Signed)
Pt to US at this time.

## 2016-09-14 NOTE — ED Notes (Signed)
CXR at bedside

## 2016-09-14 NOTE — ED Notes (Signed)
Assuming care of this pt.  Report received from [place]

## 2016-09-14 NOTE — ED Notes (Signed)
D/W Dr Nonnie Done pt with continued tachycardia and WBC elevation for possible code sepsis.

## 2016-09-14 NOTE — ED Notes (Signed)
Bld cx at bedside.

## 2016-09-14 NOTE — ED Notes (Signed)
Repeat lactate sent to lab

## 2016-09-14 NOTE — ED Notes (Signed)
Pt back from CT. Pt ambulated to BR.  Pt requesting pain medication for R lower back/flank pain.  PRN meds ordered. Will complete orders.  Family at bedside x2. Updated on POC. Pt to be admitted to hospital.

## 2016-09-14 NOTE — ED Notes (Signed)
CXR at bedside.   Bilirubin, Dir: 4.9 mg/dL [Ref Range: <0.2]  Bilirubin, Tot: 6.36 mg/dL [Ref Range: <1.2]  AST (SGOT): 156 U/L [Ref Range: 0 - 32]  ALT (SGPT): 77 U/L [Ref Range: 0 - 33]  Alkaline Phos: 1418 U/L [Ref Range: 35

## 2016-09-14 NOTE — ED Notes (Signed)
700 ml water for ct

## 2016-09-14 NOTE — ED Notes (Signed)
Code sepsis called.

## 2016-09-14 NOTE — ED Notes (Signed)
Pt returned from [place] and is placed back on monitor.  HR 115 ST

## 2016-09-14 NOTE — ED Floor Report (Signed)
ED to IP Handoff    Report created by Jaynee Eagles, RN at 11:32 PM 09/14/2016.     HANDOFF REPORT UPDATE/CHANGES (changes in patient status/care/events prior to transfer)  By who:  Time:   Additional information:                                                                                                                                                     Laurie [name] is a 51 year old female.    Brief Summary of ED Visit : Imaging  X-ray Chest Single View Frontal    Result Date: 09/14/2016  IMPRESSION: Patchy opacity in the right upper lung may represent developing pneumonia versus atelectasis in the appropriate context.     Ct Chest With Contrast    Result Date: 09/14/2016  IMPRESSION: Multiple new intraparenchymal ground-glass nodular opacities in the right left lung, measuring up to 6 mm. These are concerning for metastases, especially given short interval growth. The previously seen right upper lobe opacities were not reproduced on CT evaluation. Please refer to concurrently performed CT abdomen/pelvis for findings below the diaphragm.     Ct Abdomen And Pelvis With Contrast    Result Date: 09/14/2016  IMPRESSION: CT scan of the abdomen and pelvis with IV contrast 17 cm mass involving the right lobe of the liver and segment 4 consistent with known hepatocellular carcinoma which significantly encases and displaces the portal veins, hepatic veins and IVC but no evidence of thrombus at this time. The mass has increased in size from 11 cm on the prior CT. Small amount of adjacent ascites. Small amount of perihepatic lymphadenopathy. This mass likely accounts for the patient's right flank pain.         Assessment and Care Plan   51 yo female with h/o HTN, DM T2, HCC. Presents to the ED with increasing right-sided back pain 3 days.   #Leukocytosis-WBC 15.  Pt is afebrile but tachycardic.  Procalcitonin is elevated 0.35.  Bedside US showed no ascites.  Unknown source.  Hold antibiotics for now.     -Follow up blood culture.     # back pain likely referred from [facility] cancer progression.    -Continue home regimen of roxicodone 5 mg q 4 hours prn for moderate, 10 mg q 4 hours prn severe pain.  With 5 mg q 4 hrs prn breakthrough pain.      #HCC-CT as above.  Pt did not start chemo pending insurance approval.    #transaminitis  #elevated alkaline phos  -Consult hepatology.      #HTN normotensive  -Hold home Cozaar    #DM T2 last hemoglobin A1c 7.3 (07/09/2016)  -HA1c  -hold home oral medications start SSI low correction.      FEN: Diabetic, low-salt diet.    -Nutrion consult  DVT prophylaxis: Lovenox      RN shift assessment exceptions to WDL: + R flank pain and decreased appetite. Pt with metastatic liver [place] without tx.     Any significant events and interventions with responses:  Code sepsis all labs drawn. Pt had CT and [place].     Radiologic studies not completed: n/a  (None unless otherwise noted)    Chief Complaint   Patient presents with    Back Pain     Patient states diagnosed with liver cancer 2 months ago, complaints of increased pain to back right sides started 3 days ago, decreased appetite, denies fevers.       Admitted for:     Code Status:  Please refer to In-pt admitting doctors orders     Level of Care: Tele     Is patient septic? yes If yes, complete below:    BC x 2 drawn? yes  If No explain:      Repeat lactate needed? yes  If Yes, when is it due?  0122    All initial antibiotics given?  no  If No, explain:  None ordered by ER    Amount of IV fluids received 0 ml    Is patient on Heparin? no If yes, complete below:     Time Heparin bolus was given:     Additional drips patient is on: n/a    Cardiac rhythm: ST 120s    Oxygen Delivery: None    Past Medical History:   Diagnosis Date    DM II (diabetes mellitus, type II), controlled (CMS-HCC) 07/23/2016    GERD (gastroesophageal reflux disease)     Hepatocellular carcinoma (CMS-HCC)     Hypertension        Past Surgical History:   Procedure  Laterality Date    CESAREAN SECTION, LOW TRANSVERSE      times 3    CHOLECYSTECTOMY  2015       Allergies: Review of patient's allergies indicates no known allergies.    ED Fall Risk: (!) Yes    Skin issues:  no    >> If yes, note areas of skin breakdown. See appropriate photos.      Ambulatory:  yes    Sitter needed: no    Suicide Risk:  no    Isolation Required: no     >> If yes , what type of isolation:     Is patient in custody?  no    Is patient in restraints? no    Vitals:    09/14/16 1628 09/14/16 1715 09/14/16 1919 09/14/16 2021   BP: 138/84 140/74 140/70 143/70   BP Location: Right arm  Right arm Right arm   BP Patient Position: Supine  Lying right side Supine   Pulse: (!) 119 (!) 117 (!) 116 107   Resp: 20 19 24  (!) 29   Temp: 98.4 F (36.9 C)  99.8 F (37.7 C)    SpO2: 99%  97% 99%   Weight:       Height:           Blood Cx Set #: 2 (09/14/16 1732 : Worth, Luvenia Redden, RN)  Tubes Collected: Nyoka Cowden PST on Ice (09/14/16 1925 : Jaynee Eagles, RN)  Initial Lactate (time acquired) : 1920 (09/14/16 1925 : Jaynee Eagles, RN)    Lab Results   Component Value Date    WBC 15.2 (H) 09/14/2016    RBC 3.70 (L) 09/14/2016    HGB 9.8 (L)  09/14/2016    HCT 32.8 (L) 09/14/2016    MCV 88.6 09/14/2016    MCHC 29.9 (L) 09/14/2016    RDW 20.4 (H) 09/14/2016    PLT 682 (H) 09/14/2016    MPV 9.8 09/14/2016       Lab Results   Component Value Date    NA 134 (L) 09/14/2016    K 4.3 09/14/2016    CL 94 (L) 09/14/2016    BICARB 21 (L) 09/14/2016    BUN 9 09/14/2016    CREAT 0.43 (L) 09/14/2016    GLU 167 (H) 09/14/2016     9.6 09/14/2016       Lab Results   Component Value Date    MG 2.0 09/14/2016    LACTATE 1.8 09/14/2016       No results found for: CPK, CKMBH, TROPONIN    No results found for: PH, PCO2, O2CONTENT, IVHC3, IVBE, O2SAT, UNPH, UNPCO2, ARTPH, ARTPCO2, ARTO2CNT, IAHC3, IABE, ARTO2SAT, UNAPH, UNAPCO2    No results found for this visit on 09/14/16.      Patient Lines/Drains/Airways Status    Active PICC Line / CVC  Line / PIV Line / Drain / Airway / Intraosseous Line / Epidural Line / ART Line / Line Type / Wound     Name: Placement date: Placement time: Site: Days:    Peripheral IV - 20 G Left Antecubital 09/14/16   1655   Antecubital   less than 1                    ED Handoff Report is ready for review.  Admitting RN may reach Emergency Department RN, Jaynee Eagles, RN, at [phone] with any questions.

## 2016-09-14 NOTE — ED Provider Notes (Signed)
Emergency Department Note  DOS: The Date of Service for the Emergency Room encounter is 09/14/2016  3:20 PM    CC :   Chief Complaint   Patient presents with    Back Pain     Patient states diagnosed with liver cancer 2 months ago, complaints of increased pain to back right sides started 3 days ago, decreased appetite, denies fevers.       HPI :   51 year old  female presents to the ED with right-sided abdominal pain, worse over the past few days.  Patient is an unfortunate case who recently was diagnosed with hepatocellular carcinoma but is not undergoing active chemotherapy at this time.  Patient was denied charity care at our institution earlier this month.  Patient states that she has been taking Tylenol to help her pain but that this is not working.  Has no other pain medication.  Denies fevers or chills, nausea or vomiting, urinary symptoms.  Denies diarrhea, melena, bloody stool.      Past Medical History :   Past Medical History:   Diagnosis Date    DM II (diabetes mellitus, type II), controlled (CMS-HCC) 07/23/2016    GERD (gastroesophageal reflux disease)     Hepatocellular carcinoma (CMS-HCC)     Hypertension        Past Surgical history :   Past Surgical History:   Procedure Laterality Date    CESAREAN SECTION, LOW TRANSVERSE      times 3    CHOLECYSTECTOMY  2015            What To Do With Your Medications      CONTINUE taking these medications       Add'l Info    acetaminophen 325 MG tablet   Commonly known as:  TYLENOL   Take 2 tablets (650 mg) by mouth every 6 hours as needed (pain).    Quantity:  60 tablet   Refills:  0       glimepiride 4 MG tablet   Commonly known as:  AMARYL   Take 4 mg by mouth every morning.    Refills:  0       losartan 25 MG tablet   Commonly known as:  COZAAR   Take 25 mg by mouth daily.    Refills:  0       metFORMIN 1000 MG tablet   Commonly known as:  GLUCOPHAGE   Take 1,000 mg by mouth 2 times daily (with meals).    Refills:  0       omeprazole 20 MG capsule   Commonly  known as:  PRILOSEC   Take 20 mg by mouth daily.    Refills:  0       ondansetron 8 MG tablet   Commonly known as:  ZOFRAN   Take 1 tablet (8 mg) by mouth every 8 hours as needed for Nausea/Vomiting.    Quantity:  30 tablet   Refills:  0       oxyCODONE 5 MG immediate release tablet   Commonly known as:  ROXICODONE   Take 1 tablet (5 mg) by mouth every 4 hours as needed for Severe Pain (Pain Score 7-10).    Quantity:  60 tablet   Refills:  0       pioglitazone 30 MG tablet   Commonly known as:  ACTOS   Take 30 mg by mouth daily.    Refills:  0  Allergies :  Review of patient's allergies indicates no known allergies.    Social History :   Social History     Social History    Marital status: Married     Spouse name: N/A    Number of children: N/A    Years of education: N/A     Occupational History    Not on file.     Social History Main Topics    Smoking status: Never Smoker    Smokeless tobacco: Never Used    Alcohol use No    Drug use: Not on file    Sexual activity: Not on file     Other Topics Concern    Not on file     Social History Narrative     Family history  :  Family History   Problem Relation Age of Onset    Hypertension Mother     Dementia Father     Diabetes Father     No Known Problems Daughter     No Known Problems Maternal Grandmother     No Known Problems Maternal Grandfather     No Known Problems Paternal Grandmother     No Known Problems Paternal Grandfather     Diabetes Sister     No Known Problems Brother     No Known Problems Daughter     No Known Problems Daughter     No Known Problems Brother     No Known Problems Brother     Diabetes Sister     No Known Problems Sister     No Known Problems Sister        Review of Systems   Review of Systems   Constitutional: Negative for chills, diaphoresis and fever.   Eyes: Negative for pain.   Respiratory: Negative for cough and shortness of breath.    Cardiovascular: Negative for chest pain.   Gastrointestinal:  Positive for abdominal pain. Negative for blood in stool, constipation, diarrhea, melena, nausea and vomiting.   Genitourinary: Negative for dysuria, frequency and hematuria.   Musculoskeletal: Negative for back pain and neck pain.   Skin: Positive for rash.   Neurological: Negative for dizziness, seizures and headaches.   Psychiatric/Behavioral: Negative for substance abuse.           Physical Exam :    09/14/16  1331 09/14/16  1628 09/14/16  1715 09/14/16  1919   BP: 146/77 138/84 140/74 140/70   Pulse: (!) 120 (!) 119 (!) 117 (!) 116   Resp: 18 20 19 24    Temp: 98.4 F (36.9 C) 98.4 F (36.9 C)  99.8 F (37.7 C)   SpO2: 99% 99%  97%     O2 sat is normal  Physical Exam   Constitutional: She is oriented to person, place, and time. She appears to be writhing in pain and jaundiced. She appears distressed.   HENT:   Mouth/Throat: Oropharynx is clear and moist.   Eyes: Conjunctivae are normal. Pupils are equal, round, and reactive to light. Scleral icterus is present.   Neck: Normal range of motion. Neck supple.   Cardiovascular: Regular rhythm, normal heart sounds and intact distal pulses.  Tachycardia present.  Exam reveals no gallop and no friction rub.    No murmur heard.  Pulmonary/Chest: Effort normal and breath sounds normal. No respiratory distress. She has no wheezes. She has no rales. She exhibits no tenderness.   Abdominal: Soft. Bowel sounds are normal. She exhibits distension. She exhibits no  fluid wave, no ascites and no mass. There is tenderness in the right upper quadrant. There is CVA tenderness. There is no rebound and no guarding.   Right-sided CVA tenderness, Bedside ultrasound shows no ascites   Neurological: She is alert and oriented to person, place, and time.   Skin: Skin is warm and dry. No rash noted. No erythema. No pallor.   Psychiatric: Mood, memory, affect and judgment normal.       LABS  Results for orders placed or performed during the hospital encounter of 09/14/16   CBC w/ Diff  [place]   Result Value Ref Range    WBC 15.2 (H) 4.0 - 10.0 1000/mm3    RBC 3.70 (L) 3.90 - 5.20 mill/mm3    Hgb 9.8 (L) 11.2 - 15.7 gm/dL    Hct 32.8 (L) 34.0 - 45.0 %    MCV 88.6 79.0 - 95.0 um3    MCH 26.5 26.0 - 32.0 pgm    MCHC 29.9 (L) 32.0 - 36.0 g/dL    RDW 20.4 (H) 12.0 - 14.0 %    MPV 9.8 9.4 - 12.4 fL    Plt Count 682 (H) 140 - 370 1000/mm3    Absolute Nucleated RBC 0.2 (H) <0.1 1000/mm3    NRBC 1 <1 /100 WBC    Segs 81 %    Lymphocytes 9 %    Monocytes 3 %    Basophils 1 %    ANC-Manual Mode 12.6 (H) 1.6 - 7.0 1000/mm3    Abs Lymphs 1.4 0.8 - 3.1 1000/mm3    Abs Monos 0.5 0.2 - 0.8 1000/mm3    Abs Basophils 0.2 (H) <0.1 1000/mm3    Diff Type Manual    Prothrombin Time, Blood Blue   Result Value Ref Range    PT,Patient 14.4 (H) 9.7 - 12.5 sec    INR 1.4    Basic Metabolic Panel, Blood Green Plasma Separator Tube   Result Value Ref Range    Glucose 167 (H) 70 - 99 mg/dL    BUN 9 6 - 20 mg/dL    Creatinine 0.43 (L) 0.51 - 0.95 mg/dL    GFR >60 mL/min    Sodium 134 (L) 136 - 145 mmol/L    Potassium 4.3 3.5 - 5.1 mmol/L    Chloride 94 (L) 98 - 107 mmol/L    Bicarbonate 21 (L) 22 - 29 mmol/L    Anion Gap 19 (H) 7 - 15 mmol/L    Calcium 9.6 8.5 - 10.6 mg/dL   Liver Panel, Blood Green Plasma Separator Tube   Result Value Ref Range    Total Protein 8.4 (H) 6.0 - 8.0 g/dL    Albumin 2.8 (L) 3.5 - 5.2 g/dL    Bilirubin, Dir 4.9 (H) <0.2 mg/dL    Bilirubin, Tot 6.36 (H) <1.2 mg/dL    AST (SGOT) 156 (H) 0 - 32 U/L    ALT (SGPT) 77 (H) 0 - 33 U/L    Alkaline Phos 1418 (H) 35 - 140 U/L   Magnesium, Blood Green Plasma Separator Tube   Result Value Ref Range    Magnesium 2.0 1.6 - 2.6 mg/dL   Lipase, Blood Green Plasma Separator Tube   Result Value Ref Range    Lipase 33 13 - 60 U/L   Urinalysis with Culture Reflex, when indicated   Result Value Ref Range    Type Not Specified     Color Amber Yellow    Appearance Slightly Hazy Clear  Specific Gravity >=1.030 (X) 1.002 - 1.030    pH 6.0 5.0 - 8.0    Protein 3+ (A)  Negative    Glucose 1+ (A) Negative    Ketones 2+ (A) Negative    Bilirubin 3+ (A) Negative    Blood Negative Negative    Urobilinogen Negative Negative    Nitrite Negative Negative    Leuk Esterase Negative Negative    WBC 0-2 0-2/HPF    RBC 0-2 0-2/HPF    Bacteria Rare None-Rare/HPF    Squam. Epithelial Cell 0-5(RARE) <6-10(FEW)    Mucus Rare None-Rare/HPF    Hyaline Cast 3-5 (A) 0-2/LPF    Granular Cast 0-2 (A) None/LPF   MDIFF   Result Value Ref Range    Bands 2 0 - 15 %    Metamyelocytes 2 %    Myelocytes 2 %    Number of Cells Counted 114     Immature Granulocytes Absolute Manual 0.6 (H) 0.0 - 0.1 1000/mm3    Plt Est Increased     RBC Comment Present     Macrocytic 2+     Polychromasia 3+          DIAGNOSTIC STUDIES  X-ray Chest Single View Frontal    Result Date: 09/14/2016  Narrative: EXAM DESCRIPTION: X-RAY CHEST SINGLE VIEW FRONTAL CLINICAL HISTORY: Right flank pain, tachycardia COMPARISON: Chest CT from 07/27/2016 FINDINGS: Frontal chest radiograph. Lungs are hypoinflated. Normal cardiomediastinal silhouette and pulmonary vasculature. Subtle increased opacity in the right upper lung. No pleural effusion or pneumothorax. No acute osseous abnormalities. Linear metallic clips in the right upper quadrant.     Impression: IMPRESSION: Patchy opacity in the right upper lung may represent developing pneumonia versus atelectasis in the appropriate context.     Clinical Decision Making   51 year old  female presents to the ED with right-sided abdominal pain, worse over the past few days.  Patient is status post cholecystectomy, however given her history of hepatocellular carcinoma that is untreated, suspect that she is having cancer related pain.  The bowel obstruction or pancreatic pathology is also certainly possible.  metabolic labs as ordered   IV pain medication  CT abdomen and pelvis    ED course   Bedside ultrasound showed no evidence of ascites that would require a paracentesis  Labs are concerning for  progression of disease with hyperbilirubinemia which is worse than prior, elevation of her liver enzymes and substantial elevation in her alkaline phosphatase, unclear etiology given her lack of a gallbladder but suspect bony metastasis  Chest x-ray shows a patchy opacity in the right upper lung which is of unclear etiology but radiology is concerned may be a pneumonia  Labs additional show a leukocytosis and the patient is tachycardic both are unclear.  Will add on a CT chest to be abdomen and pelvis for further evaluation  Patient signed out to Dr. Thera Flake and admitted to the hospitalist for pain management as well as progression of her known cancer.  Plan is to follow up CT scans as well as a right upper quadrant ultrasound.  Cultures obtained but no clear etiology of an infection has emerged so will hold off on antimicrobials at this time       Laurie [name]  09/15/16 1527

## 2016-09-14 NOTE — ED Notes (Signed)
09/14/2016 1:38 PM Rachell Cipro    An EKG was handed to Dr. Astrid Divine, no previous available.

## 2016-09-14 NOTE — ED MD Progress Note (Signed)
Workup Review   ct of chest shows new pulmonary nodules bilaterally.  Large liver mass with mass effect on the portal vein.   Nothing else acute.

## 2016-09-14 NOTE — ED Notes (Addendum)
WBC: 15.2 1000/mm3   Plt Count: 682 1000/mm3

## 2016-09-15 DIAGNOSIS — R109 Unspecified abdominal pain: Secondary | ICD-10-CM

## 2016-09-15 LAB — GLUCOSE (POCT)
Glucose (POCT): 103 mg/dL — ABNORMAL HIGH (ref 70–99)
Glucose (POCT): 199 mg/dL — ABNORMAL HIGH (ref 70–99)

## 2016-09-15 LAB — CBC WITH DIFF, BLOOD
ANC-Automated: 9.1 10*3/uL — ABNORMAL HIGH (ref 1.6–7.0)
Abs Lymphs: 1.8 10*3/uL (ref 0.8–3.1)
Abs Monos: 0.8 10*3/uL (ref 0.2–0.8)
Absolute Nucleated RBC: 0.1 10*3/uL (ref <0.1)
Hct: 27.3 % — ABNORMAL LOW (ref 34.0–45.0)
Hgb: 8.1 gm/dL — ABNORMAL LOW (ref 11.2–15.7)
Imm Gran %: 4 % — ABNORMAL HIGH (ref <1)
Imm Gran Abs: 0.5 10*3/uL — ABNORMAL HIGH (ref <0.1)
Lymphocytes: 15 %
MCH: 26.1 pg (ref 26.0–32.0)
MCHC: 29.7 g/dL — ABNORMAL LOW (ref 32.0–36.0)
MCV: 88.1 um3 (ref 79.0–95.0)
MPV: 9.6 fL (ref 9.4–12.4)
Monocytes: 6 %
NRBC: 1 /100 WBC (ref <1)
Plt Count: 569 10*3/uL — ABNORMAL HIGH (ref 140–370)
RBC: 3.1 10*6/uL — ABNORMAL LOW (ref 3.90–5.20)
RDW: 20.3 % — ABNORMAL HIGH (ref 12.0–14.0)
Segs: 74 %
WBC: 12.3 10*3/uL — ABNORMAL HIGH (ref 4.0–10.0)

## 2016-09-15 LAB — ECG 12-LEAD
ATRIAL RATE: 118 {beats}/min
P AXIS: 52 degrees
PR INTERVAL: 128 ms
QRS INTERVAL/DURATION: 80 ms
QT: 316 ms
QTC INTERVAL: 442 ms
R AXIS: 65 degrees
T AXIS: 29 degrees
VENTRICULAR RATE: 118 {beats}/min

## 2016-09-15 LAB — COMPREHENSIVE METABOLIC PANEL, BLOOD
ALT (SGPT): 63 U/L — ABNORMAL HIGH (ref 0–33)
AST (SGOT): 122 U/L — ABNORMAL HIGH (ref 0–32)
Albumin: 2.7 g/dL — ABNORMAL LOW (ref 3.5–5.2)
Alkaline Phos: 1052 U/L — ABNORMAL HIGH (ref 35–140)
Anion Gap: 14 mmol/L (ref 7–15)
BUN: 5 mg/dL — ABNORMAL LOW (ref 6–20)
Bicarbonate: 23 mmol/L (ref 22–29)
Bilirubin, Tot: 5.16 mg/dL — ABNORMAL HIGH (ref <1.2)
Calcium: 8.8 mg/dL (ref 8.5–10.6)
Chloride: 98 mmol/L (ref 98–107)
Creatinine: 0.36 mg/dL — ABNORMAL LOW (ref 0.51–0.95)
GFR: >60 mL/min
Glucose: 108 mg/dL — ABNORMAL HIGH (ref 70–99)
Potassium: 4.1 mmol/L (ref 3.5–5.1)
Sodium: 135 mmol/L — ABNORMAL LOW (ref 136–145)
Total Protein: 6.7 g/dL (ref 6.0–8.0)

## 2016-09-15 LAB — GLYCOSYLATED HGB(A1C), BLOOD: Glyco Hgb (A1C): 5.7 % (ref 4.8–5.8)

## 2016-09-15 MED ORDER — HYDROMORPHONE HCL 4 MG OR TABS
4.0000 mg | ORAL_TABLET | ORAL | Status: DC | PRN
Start: 2016-09-15 — End: 2016-09-15

## 2016-09-15 MED ORDER — TRAZODONE HCL 50 MG OR TABS
50.0000 mg | ORAL_TABLET | Freq: Every evening | ORAL | 0 refills | Status: AC | PRN
Start: 2016-09-15 — End: ?

## 2016-09-15 MED ORDER — TRAZODONE HCL 50 MG OR TABS
50.0000 mg | ORAL_TABLET | Freq: Every evening | ORAL | Status: DC
Start: 2016-09-15 — End: 2016-09-15

## 2016-09-15 MED ORDER — HYDROMORPHONE HCL 2 MG OR TABS
2.0000 mg | ORAL_TABLET | ORAL | Status: DC | PRN
Start: 2016-09-15 — End: 2016-09-15
  Administered 2016-09-15: 2 mg via ORAL
  Filled 2016-09-15: qty 1

## 2016-09-15 MED ORDER — HYDROMORPHONE HCL 2 MG OR TABS
2.0000 mg | ORAL_TABLET | ORAL | 0 refills | Status: DC | PRN
Start: 2016-09-15 — End: 2016-10-08

## 2016-09-15 NOTE — Discharge Instructions (Signed)
Diagnosis and Reason for Admission    You were admitted to the hospital for the following reason(s):  Back pain     Your full diagnosis list is located on this After Visit Summary in the Hospital Problems section.    What [facility] Hospital Stay    The main tests and treatments done for you during this hospitalization were:    - CT scan of abdomen, pelvis, and chest  - pain medication     The following evaluation is still important to complete after discharge from the hospital:  - follow up with your hepatologist and oncology clinic    Instructions for After Discharge    Your diet at home should be a low-salt diet.    Your activity level at home should be:  regular activity.    Specific activity restrictions:    Do not drive while taking narcotic pain medications.  Do not work with heavy or complex machinery while taking narcotic pain medications.    Wound or tube care instructions:  None    Your medication list is located on this After Visit Summary in the Current Discharge Medication List section.  Your nurse will review this information with you before you leave the hospital.    It is very important for you to keep a current medication list with you in order to assist your doctors with your medical care.  Bring this After Visit Summary with you to your follow up appointments.    Reasons to Contact a Doctor Urgently    Call 911 or return to the hospital immediately if:  You have chest pain, difficulty breathing, uncontrolled pain, vomiting blood, or other new issues     You should contact either your primary care physician or your hospital physician for any of the following reasons: questions on medication or follow up    If you have any questions about your hospital care, your medications, or if you have new or concerning symptoms soon after going home from the hospital, and you need to contact your hospital physician, your hospital physician can be contacted in the following manner:  [employer] operator at [phone].    Once you are able to see your primary care physician (PCP), your PCP will then be responsible for further medication refills, or appointment referrals.    What Needs to Happen Next After Discharge -- Appointments and Follow Up    Any appointments already scheduled at [facility] clinics will be listed in the Future Appointments section at the top of this After Visit Summary.  Any appointments that have been requested, but have not yet been scheduled, will be listed below that under Post Discharge Referrals.    Sometimes tests performed in the hospital do not yet have results by the time a patient goes home.  The following key tests will need to be followed up at your next appointment: Culture of blood    Medical Home Information    Your primary care provider or clinic currently on file at [facility] is: Maricela Curet    Handouts Given to You (if applicable)

## 2016-09-15 NOTE — Interdisciplinary (Signed)
09/15/16 1535   Referral Data   Referral Source Physician   Referral Reason Other (Comment)     ED SW received MD Consult Order in re: "assistance available for clinic f/u as pt did not start chemo pending insurance approval."     Review of the EMR and HAR notes re: pt Laurie [name] indicated that pt was uninsured and not approved for charity for treatment at [facility] per [name]. Per notes documented on 08/31/16 by [place], she received a call from family friend, [name], who reported that pt and family were aware that pt was denied charity care for her oncology treatment at [facility] and was calling on pt's behalf re: options for low/no cost cancer treatment. SW Brayton Layman discussed the option of purchasing private insurance and in order to purchase insurance via [name], residency is a requirement. Additionally, pt's friend was informed of the lack of free allopathic oncology treatment programs in [facility].Furthermore a 09/07/16 note from [facility] indicated that she also spoke w/Financial Counselor [name]; pt's charity case with [name] has been denied and her upcoming appointment w/[name] will also not be approved for charity.    E-mail sent to the Financial Counselors in re: whether pt can at least qualify for restricted Medi-Cal and was informed by  [name] [phone] that pt was denied Medi-Cal due to being over income but since becoming ill her finances have changed. Pt's updated information has been submitted for review and [name] is hoping to receive a response by this Thursday, April 5th.    Bedside RN advised that pt and dtr were hoping to speak w/SW prior to d/c. SW proceeded to bedside, introduced self to pt, dtr [name] [phone] and niece [name] at bedside and agreed to translate on pt's behalf.    It was explained to pt/family that unfortunately she had not been approved for charity care nor  was her appointment at [facility] scheduled for 09/18/16 at 8:30am, but that [facility] had resubmitted pt's information and will hopefully know on Thursday, 09/17/16, if pt had been approved for restricted Medi-Cal. SW offered emotional support, attempted to explore other options for support such as assistance from pt's church or faith community, and acknowledged the concerns/sadness of pt and family who were grateful for the care provided thus far by the treatment team.      MD Emily Filbert was paged at [phone], she c/b ASAP and was informed of the lack of charity approval as well as the anticipated response re: pt's restricted Medi-Cal status on Thursday from the [employer].     SW to remain available.    Aris Georgia, LCSW  Clinical Social Worker  [phone]

## 2016-09-15 NOTE — Interdisciplinary (Signed)
Nutrition initial assessment RN trigger: wt loss, Consult received re; diabetic, low-salt diet. With decrease appetite   A: 51 year old female with h/o hypertension, diabetes and newly diagnosed hepatocellular carcinoma admitted with Abdominal pain, back pain, pt currently in ED  Past Medical History:   Diagnosis Date    DM II (diabetes mellitus, type II), controlled (CMS-HCC) 07/23/2016    GERD (gastroesophageal reflux disease)     Hepatocellular carcinoma (CMS-HCC)     Hypertension      Past Surgical History:   Procedure Laterality Date    CESAREAN SECTION, LOW TRANSVERSE      times 3    CHOLECYSTECTOMY  2015     Current Diet Rx: Diet Custom: Carb Limited (4 Carbs) family members at bedside, able to translate; pt reports good appetite and intake, not intended to lose wt but lost wt with diet modification (cut down CHO intake, eat less) since Dx'd [place] about 2 months ago; no N/V/D, provide CHO limited/low Na diet education and Spanish handouts given  Allergies/Intolerances/Preferences: NKFA, no special diet PTA    Anthropometrics:  Height: 4\' 11"  (149.9 cm) Adm Weight: 86.7 kg (191 lb 1.6 oz) filed at 09/14/2016 1331 via standing scale, Body mass index is 38.6 kg/(m^2). @193 %IBW of IBW 45kg, UBW 215# (98kg) 2 months ago  Wt Readings from Last 60 Encounters:   09/14/16 86.7 kg (191 lb 1.6 oz)   08/20/16 87.5 kg (193 lb)   08/18/16 88 kg (193 lb 14.4 oz)   08/12/16 88.7 kg (195 lb 9.6 oz)   08/05/16 91.2 kg (201 lb 1.6 oz)   08/05/16 91.5 kg (201 lb 11.2 oz)   07/27/16 92.5 kg (204 lb)   07/27/16 91.8 kg (202 lb 7 oz)     GI: +BS, +flatus, LBM PTA  Skin: WDL, Edema: NA  Nutrition-Focused Physical Exam: not indicated  Pertinent Labs: A1c 7.3 on 1/25, FSBG NA  Recent Labs      09/14/16   1607  09/15/16   0454   NA  134*  135*   K  4.3  4.1   BUN  9  5*   CREAT  0.43*  0.36*   GLU  167*  108*   [place]  9.6  8.8   MG  2.0   --    HGB  9.8*  8.1*   HCT  32.8*  27.3*   ALB  2.8*  2.7*   ALT  77*  63*   AST  156*  122*      TBILI  6.36*  5.16*   DBILI  4.9*   --    WBC  15.2*  12.3*   MCH  26.5  26.1   MCV  88.6  88.1      Pertinent meds: SSI  Est needs: (based on 87kg); 1570-1915kcal (18-22kcal/kg; REE per Mifflin-St Jeor 1400kcal), 68-90g protein (1.5-2g/kgIBW), 1.6-1.9L fluid(55ml/kcal) or per MD r/t hyponatremia  Nutrition status; may inadequate, Hydration status; defer to MD, UOP NA  D(nutritional diagnosis): potential inadequate oral intake r/t medical condition AEB newly dx'd [facility]   I(ntervention): Goal: To receive >80% of nutrition needs with acceptable tolerance  PLANS / RECOMMENDATIONS:  - continue current CHO limited diet; may add low Na restriction if fluid accumulation   - weekly wt to monitor  M/E(monitoring/evaluation):   Disposition: pending clinical course.   Education: if/when appropriate   Discussed plans and recommendations with care team members including 1st call   RD will monitor medical course/nutrition  status and RD/DTR follow up per department policy   Foster Simpson RD, [place], pager [phone]

## 2016-09-15 NOTE — Interdisciplinary (Signed)
Discussed discharge instructions with the patient. The patient was educated regarding medications, purpose, route, doses, frequency, side effects, and special considerations. The patient was also educated regarding follow ups, diet, activity, and incision care.     The patient verbalized understanding and was able to state signs and symptoms of infection and when to notify MD or call 911.    PIV was removed with catheter intact. Pressure was held for 5 minutes and an occlusive dressing was applied. No bleeding/bruising noted.    Pt sent to discharge pharmacy to pick-up prescriptions.

## 2016-09-15 NOTE — Progress Notes (Signed)
HOSPITALIST ATTENDING DAILY PROGRESS NOTE      Subjective/Review of Systems  Admitted overnight.  Pain improved with fentanyl.  Has not yet tried po pain medications.  Reports history of nausea with oxycodone.  No f/c/n/v/d.  LE edema stable.      Physical Exam  BP  Min: 125/67  Max: 143/70  Temp  Min: 98.2 F (36.8 C)  Max: 99.8 F (37.7 C)  Pulse  Min: 99  Max: 119  Resp  Min: 9  Max: 29  SpO2  Min: 90 %  Max: 99 %    BP 125/67 (BP Location: Right arm, BP Patient Position: Sitting)   Pulse 108   Temp 98.5 F (36.9 C)   Resp 16   Ht 4\' 11"  (1.499 m)   Wt 86.7 kg (191 lb 1.6 oz)   SpO2 97%   BMI 38.6 kg/m2    04/02 0600 - 04/03 0559  In: 500 [I.V.:500]  Out: -     GEN: pleasant woman sitting in chair, alert, NAD  HEENT: sclera icteric, mmm  Neck:  Supple, No LAD  CVS: RRR, No M/R/G  Lungs: CTAB    Abdomen: Normal BS.  Soft, ND/NT.   Back: L flank ttp  Extremities: BLE 2+ edema.  DP/PT 2+.  Skin: Intact.  Jaundice    Data    Na 135* (04/03) CL 98 (04/03) BUN 5* (04/03) GLU   108* (04/03)   K 4.1 (04/03) CO2 23 (04/03) Cr 0.36* (04/03)      WBC 12.3* (04/03) HGB 8.1* (04/03) PLT 569* (04/03)    HCT 27.3* (04/03)      PT 14.4* (04/02) PTT     INR 1.4 (04/02)        Scheduled Medications   docusate sodium  250 mg HS    enoxaparin  40 mg Daily    insulin lispro  1-10 Units 4x Daily WC    polyethylene glycol  17 g Daily    senna  2 tablet QAM    sodium chloride (PF)  3 mL Q8H    traZODone  50 mg HS     Continuous Medications   sodium chloride       PRN Medications   bisacodyl  10 mg Daily PRN    dextrose  12.5 g PRN    glucagon  1 mg Once PRN    glucose  4 tablet PRN    glucose  1 Tube PRN    HYDROmorphone  2 mg Q3H PRN    HYDROmorphone  4 mg Q3H PRN    magnesium hydroxide  30 mL Nightly PRN    nalOXone  0.1 mg Q2 Min PRN    ondansetron  4 mg Q6H PRN    sodium chloride (PF)  3 mL PRN    sodium chloride   Continuous PRN       Hospital Problem List   Active Hospital Problems    Diagnosis POA     Abdominal pain [R10.9] Yes      Resolved Hospital Problems    Diagnosis POA   No resolved problems to display.       Inpatient Checklist  Discharge planning: clinical improvement  Foley: No foley catheter  VTE prophylaxis: LMWH  Diet: Diet Custom: Carb Limited (4 Carbs)  IV fluids: No IV fluids  Access: PIVs  Code status: Full Code    Problem-based Plan  51 yo female with h/o HTN, DM T2, HCC. Presents to the ED with  increasing right-sided back pain 3 days.   #Leukocytosis-WBC 15.  Pt is afebrile but tachycardic.  Procalcitonin is elevated 0.35.  Bedside US showed no ascites.  Unknown source.  Hold antibiotics for now.    -Follow up blood culture.     # back pain likely referred from [facility] cancer progression.    -po dilaudid prn  -consider switching back to home oxycodone     #HCC-CT as above.  Pt did not start chemo pending insurance approval.   case manager contacted to see if any assistance available for clinic f/u.  Hepatology notified regarding admission.     #HTN normotensive  -Hold home Cozaar    #DM T2 last hemoglobin A1c 7.3 (07/09/2016)  -HA1c  -hold home oral medications start SSI low correction.    FEN: Diabetic, low-salt diet.    -Nutrion consult

## 2016-09-16 LAB — MRSA SURVEILLANCE CULTURE

## 2016-09-16 NOTE — Discharge Summary (Signed)
Date of Admission:  09/14/2016  Date of Discharge:  09/15/2016    Patient Name:  Laurie [name]    Principal Diagnosis (required):  cancer related pain    Hospital Problem List (required):  Active Hospital Problems    Diagnosis    Abdominal pain [R10.9]      Resolved Hospital Problems    Diagnosis   No resolved problems to display.       Additional Hospital Diagnoses ("rule out" or "suspected" diagnoses, etc.):     [place]    Principal Procedure During This Hospitalization (required):  CT imaging of chest, abdomen pelvis    Other Procedures Performed During This Hospitalization (required):  Ultrasound of abdomen    Procedure results are available in Chart Review in Epic.  For those providers external to [facility], the key procedure results are listed below:    CT chest:  Multiple new intraparenchymal ground-glass nodular opacities in the right left lung, measuring up to 6 mm. These are concerning for metastases, especially given short interval growth.    CT a/p:  17 cm mass involving the right lobe of the liver and segment 4 consistent with known hepatocellular carcinoma which significantly encases and displaces the portal veins, hepatic veins and IVC but no evidence of thrombus at this time. The mass has increased in size from 11 cm on the prior CT. Small amount of adjacent ascites. Small amount of perihepatic lymphadenopathy. This mass likely accounts for the patient's right flank pain.    [place] abd:  Hepatomegaly with 17.7 cm mass consistent with hepatocellular carcinoma. This is better evaluated on same day CT.  No definite biliary dilatation but biliary system is difficult to visualize due to the infiltrating mass.  Status post cholecystectomy.    Consultations Obtained During This Hospitalization:  None    Key consultant recommendations:       Reason for Admission to the Hospital / History of Present Illness:  51 year old female with history of hypertension, diabetes and newly diagnosed hepatocellular carcinoma.  Patient followed by Dr. Tenna Child here at [facility]. Presents to the ED with complaint of back pain 3 days, she endorses decreased appetite. She describes it as 1/10, constant pain, burning sensation.  She localizes pain from LBP,  Upper back.  She denies trauma. She denies fevers, chills.  Occasional  night sweats. No chest pain or palpitation. No shortness of breath but c/o non productive cough x 3 weeks that has been improving last 2-3 days. No abdominal pain,  Nausea, vomiting or diarrhea. No pain during urination but endorses increase frequency x 2 months.  Tolerating food/fluid but endorses decrease appetite.     Hospital Course by Problem (required):    51 yo female with h/o HTN, DM T2, HCC. Presents to the ED with increasing right-sided back pain 3 days.    #Leukocytosis-WBC 15. No fevers or focal symptoms. ua neg.  Cultures sent, ngtd after 24h.    # back pain likely referred from [facility] cancer progression.   -had nausea with oxycodone so tried po dilaudid. Pt had good response with dilaudid so provided with rx for dc.  Instructed pt to return if uncontrolled sx or develops confusion or other adverse effects.  Stool softener ordered.    #HCC-CT as above. Pt did not start chemo pending insurance approval.  social work contacted to see if any assistance available for clinic f/u: denied charity care but financial counselor assisted in [place] application. Decision on medical should be available on 4/5.  Hepatology notified regarding admission.    #HTN normotensive  -Hold home Cozaar    #DM T2 last hemoglobin A1c 7.3 (07/09/2016)  -resume home meds on dc    Tests Outstanding at Discharge Requiring Follow Up:  Culture of blood (final result)    Discharge Condition (required):  Fair.    Key Physical Exam Findings at Discharge:  No significant physical examination findings at the time of discharge.    Discharge Diet:  Low-salt and Diabetic / low-carbohydrate.    Discharge Medications:     What To Do With Your  Medications      START taking these medications       Add'l Info    HYDROmorphone 2 MG tablet   Commonly known as:  DILAUDID   Take 1 tablet (2 mg) by mouth every 3 hours as needed for Severe Pain (Pain Score 7-10).    Quantity:  30 tablet   Refills:  0       traZODone 50 MG tablet   Commonly known as:  DESYREL   Take 1 tablet (50 mg) by mouth At bedtime as needed for Insomnia.    Quantity:  30 tablet   Refills:  0         CONTINUE taking these medications       Add'l Info    acetaminophen 325 MG tablet   Commonly known as:  TYLENOL   Take 2 tablets (650 mg) by mouth every 6 hours as needed (pain).    Quantity:  60 tablet   Refills:  0       glimepiride 4 MG tablet   Commonly known as:  AMARYL   Take 4 mg by mouth every morning.    Refills:  0       losartan 25 MG tablet   Commonly known as:  COZAAR   Take 25 mg by mouth daily.    Refills:  0       metFORMIN 1000 MG tablet   Commonly known as:  GLUCOPHAGE   Take 1,000 mg by mouth 2 times daily (with meals).    Refills:  0       omeprazole 20 MG capsule   Commonly known as:  PRILOSEC   Take 20 mg by mouth daily.    Refills:  0       ondansetron 8 MG tablet   Commonly known as:  ZOFRAN   Take 1 tablet (8 mg) by mouth every 8 hours as needed for Nausea/Vomiting.    Quantity:  30 tablet   Refills:  0       pioglitazone 30 MG tablet   Commonly known as:  ACTOS   Take 30 mg by mouth daily.    Refills:  0         STOP taking these medications          oxyCODONE 5 MG immediate release tablet   Commonly known as:  ROXICODONE            Where to Get Your Medications      These medications were sent to [facility]  [address], [place] [address]    Hours:  Mon-Fri 8:30am-7:00pm, Sat-Sun 9am-5:00pm Phone:  [phone]     HYDROmorphone 2 MG tablet    traZODone 50 MG tablet             Allergies:  No Known Allergies    Discharge Disposition:  Home.  Discharge Code Status:  Full code / full care  This code status is not changed from  the time of admission.    Follow Up Appointments:    Scheduled appointments:  Future Appointments  Date Time Provider [place]   09/18/2016 8:30 AM Daneil Dan, MD [facility]P [facility]       For appointments requested for after discharge that have not yet been scheduled, refer to the Post Discharge Referrals section of the After Visit Summary.    Discharging 70 Contact Information:  [facility] operator at [phone].

## 2016-09-18 ENCOUNTER — Ambulatory Visit (HOSPITAL_BASED_OUTPATIENT_CLINIC_OR_DEPARTMENT_OTHER): Payer: MEDICAID | Admitting: Gastroenterology

## 2016-09-19 LAB — BLOOD CULTURE
Blood Culture Result: NO GROWTH
Blood Culture Result: NO GROWTH

## 2016-09-28 ENCOUNTER — Inpatient Hospital Stay: Admit: 2016-09-28 | Payer: Self-pay

## 2016-10-04 ENCOUNTER — Emergency Department (HOSPITAL_COMMUNITY): Payer: Self-pay

## 2016-10-04 ENCOUNTER — Encounter (HOSPITAL_BASED_OUTPATIENT_CLINIC_OR_DEPARTMENT_OTHER): Payer: Self-pay

## 2016-10-04 ENCOUNTER — Inpatient Hospital Stay
Admission: EM | Admit: 2016-10-04 | Discharge: 2016-10-08 | DRG: 435 | Disposition: A | Discharge status: Hospice | Payer: Self-pay | Attending: Hospitalist | Admitting: Hospitalist

## 2016-10-04 DIAGNOSIS — C220 Liver cell carcinoma: Principal | ICD-10-CM | POA: Diagnosis present

## 2016-10-04 DIAGNOSIS — Z9049 Acquired absence of other specified parts of digestive tract: Secondary | ICD-10-CM

## 2016-10-04 DIAGNOSIS — L299 Pruritus, unspecified: Secondary | ICD-10-CM | POA: Diagnosis present

## 2016-10-04 DIAGNOSIS — G893 Neoplasm related pain (acute) (chronic): Secondary | ICD-10-CM | POA: Diagnosis present

## 2016-10-04 DIAGNOSIS — K5909 Other constipation: Secondary | ICD-10-CM | POA: Diagnosis present

## 2016-10-04 DIAGNOSIS — I10 Essential (primary) hypertension: Secondary | ICD-10-CM | POA: Diagnosis present

## 2016-10-04 DIAGNOSIS — Z79899 Other long term (current) drug therapy: Secondary | ICD-10-CM

## 2016-10-04 DIAGNOSIS — D72829 Elevated white blood cell count, unspecified: Secondary | ICD-10-CM | POA: Diagnosis present

## 2016-10-04 DIAGNOSIS — R1010 Upper abdominal pain, unspecified: Secondary | ICD-10-CM

## 2016-10-04 DIAGNOSIS — Z8249 Family history of ischemic heart disease and other diseases of the circulatory system: Secondary | ICD-10-CM

## 2016-10-04 DIAGNOSIS — K831 Obstruction of bile duct: Secondary | ICD-10-CM | POA: Diagnosis present

## 2016-10-04 DIAGNOSIS — C7800 Secondary malignant neoplasm of unspecified lung: Secondary | ICD-10-CM | POA: Diagnosis present

## 2016-10-04 DIAGNOSIS — Z833 Family history of diabetes mellitus: Secondary | ICD-10-CM

## 2016-10-04 DIAGNOSIS — R160 Hepatomegaly, not elsewhere classified: Secondary | ICD-10-CM

## 2016-10-04 DIAGNOSIS — K7689 Other specified diseases of liver: Secondary | ICD-10-CM

## 2016-10-04 DIAGNOSIS — R1011 Right upper quadrant pain: Secondary | ICD-10-CM

## 2016-10-04 DIAGNOSIS — E119 Type 2 diabetes mellitus without complications: Secondary | ICD-10-CM | POA: Diagnosis present

## 2016-10-04 DIAGNOSIS — K5900 Constipation, unspecified: Secondary | ICD-10-CM

## 2016-10-04 DIAGNOSIS — Z515 Encounter for palliative care: Secondary | ICD-10-CM | POA: Diagnosis not present

## 2016-10-04 DIAGNOSIS — K219 Gastro-esophageal reflux disease without esophagitis: Secondary | ICD-10-CM | POA: Diagnosis present

## 2016-10-04 DIAGNOSIS — Z66 Do not resuscitate: Secondary | ICD-10-CM | POA: Diagnosis not present

## 2016-10-04 DIAGNOSIS — Z7984 Long term (current) use of oral hypoglycemic drugs: Secondary | ICD-10-CM

## 2016-10-04 DIAGNOSIS — K7460 Unspecified cirrhosis of liver: Secondary | ICD-10-CM | POA: Diagnosis present

## 2016-10-04 LAB — CBC WITH DIFF, BLOOD
ANC-Manual Mode: 10.4 10*3/uL — ABNORMAL HIGH (ref 1.6–7.0)
Abs Eosinophils: 0.4 10*3/uL (ref 0.1–0.5)
Abs Lymphs: 1.6 10*3/uL (ref 0.8–3.1)
Abs Monos: 1.2 10*3/uL — ABNORMAL HIGH (ref 0.2–0.8)
Absolute Nucleated RBC: 0.1 10*3/uL (ref <0.1)
Eosinophils: 3 %
Hct: 31.9 % — ABNORMAL LOW (ref 34.0–45.0)
Hgb: 9.2 gm/dL — ABNORMAL LOW (ref 11.2–15.7)
Lymphocytes: 11 %
MCH: 27.5 pg (ref 26.0–32.0)
MCHC: 28.8 g/dL — ABNORMAL LOW (ref 32.0–36.0)
MCV: 95.5 um3 — ABNORMAL HIGH (ref 79.0–95.0)
MPV: 8.9 fL — ABNORMAL LOW (ref 9.4–12.4)
Monocytes: 8 %
NRBC: 1 /100 WBC (ref <1)
Plt Count: 457 10*3/uL — ABNORMAL HIGH (ref 140–370)
RBC: 3.34 10*6/uL — ABNORMAL LOW (ref 3.90–5.20)
RDW: 22.1 % — ABNORMAL HIGH (ref 12.0–14.0)
Segs: 68 %
WBC: 14.4 10*3/uL — ABNORMAL HIGH (ref 4.0–10.0)

## 2016-10-04 LAB — URINALYSIS WITH CULTURE REFLEX, WHEN INDICATED
Blood: NEGATIVE
Ketones: NEGATIVE
Leuk Esterase: NEGATIVE
Nitrite: NEGATIVE
Specific Gravity: 1.019 (ref 1.002–1.030)
pH: 6 (ref 5.0–8.0)

## 2016-10-04 LAB — MDIFF
Bands: 4 % (ref 0–15)
Immature Granulocytes Absolute Manual: 0.9 10*3/uL — ABNORMAL HIGH (ref 0.0–0.1)
Metamyelocytes: 4 %
Myelocytes: 2 %
Number of Cells Counted: 115
Plt Est: INCREASED

## 2016-10-04 LAB — COMPREHENSIVE METABOLIC PANEL, BLOOD
ALT (SGPT): 39 U/L — ABNORMAL HIGH (ref 0–33)
AST (SGOT): 78 U/L — ABNORMAL HIGH (ref 0–32)
Albumin: 2.8 g/dL — ABNORMAL LOW (ref 3.5–5.2)
Alkaline Phos: 815 U/L — ABNORMAL HIGH (ref 35–140)
Anion Gap: 16 mmol/L — ABNORMAL HIGH (ref 7–15)
BUN: 8 mg/dL (ref 6–20)
Bicarbonate: 23 mmol/L (ref 22–29)
Bilirubin, Tot: 15.42 mg/dL — ABNORMAL HIGH (ref <1.2)
Calcium: 9.3 mg/dL (ref 8.5–10.6)
Chloride: 94 mmol/L — ABNORMAL LOW (ref 98–107)
Creatinine: 0.51 mg/dL (ref 0.51–0.95)
GFR: >60 mL/min
Glucose: 129 mg/dL — ABNORMAL HIGH (ref 70–99)
Potassium: 3.5 mmol/L (ref 3.5–5.1)
Sodium: 133 mmol/L — ABNORMAL LOW (ref 136–145)
Total Protein: 7.3 g/dL (ref 6.0–8.0)

## 2016-10-04 LAB — PROTHROMBIN TIME, BLOOD
INR: 1.3
PT,Patient: 14.1 s — ABNORMAL HIGH (ref 9.7–12.5)

## 2016-10-04 LAB — MAGNESIUM, BLOOD: Magnesium: 2.1 mg/dL (ref 1.6–2.6)

## 2016-10-04 LAB — PHOSPHORUS, BLOOD: Phosphorous: 2.9 mg/dL (ref 2.7–4.5)

## 2016-10-04 LAB — APTT, BLOOD: PTT: 36 s — ABNORMAL HIGH (ref 25–34)

## 2016-10-04 LAB — LACTATE, BLOOD: Lactate: 2.4 mmol/L — ABNORMAL HIGH (ref 0.5–2.0)

## 2016-10-04 LAB — LIPASE, BLOOD: Lipase: 36 U/L (ref 13–60)

## 2016-10-04 MED ORDER — HYDROMORPHONE HCL 1 MG/ML IJ SOLN
1.00 mg | Freq: Once | INTRAMUSCULAR | Status: AC
Start: 2016-10-04 — End: 2016-10-04
  Administered 2016-10-04: 1 mg via INTRAVENOUS
  Filled 2016-10-04: qty 1

## 2016-10-04 NOTE — ED Notes (Signed)
Dr Coffee at side to eval

## 2016-10-04 NOTE — ED Provider Notes (Signed)
CC: Abdominal Pain     HPI: The key elements of the history are the patient is a 51 year old female h/o HTN, DM, HCC  C/o increasing in chronic abd pain x 3 days more severe than in past, not improved w/ Dialudid.  Also reported interval development of worsening yellow eyes, dark colored urine and now light-colored stool.  Increasing abdominal distention and girth.  No fevers or chills.  Poor appetite.  No vomiting.  Progressive worsening weakness.    Last admit 09/14/16   PCP: Maricela Curet  Dr. Tenna Child  All/meds: Reviewed and confirmed     PMH/PSH:   has a past medical history of DM II (diabetes mellitus, type II), controlled (CMS-HCC) (07/23/2016); GERD (gastroesophageal reflux disease); Hepatocellular carcinoma (CMS-HCC); and Hypertension.   has a past surgical history that includes Cholecystectomy (2015) and Cesarean section, low transverse.  SH:  reports that she has never smoked. She has never used smokeless tobacco. She reports that she does not drink alcohol or use illicit drugs.  FH: family history includes Dementia in her father; Diabetes in her father, sister, and sister; Hypertension in her mother; No Known Problems in her brother, brother, brother, daughter, daughter, daughter, maternal grandfather, maternal grandmother, paternal grandfather, paternal grandmother, sister, and sister.  ROS:  No fevers.  No cough.  No urinary symptoms apart from darker urine.  All other systems reviewed and are negative.    PE:   Vitals:   ED Triage Vitals   Enc Vitals Group      Blood pressure (BP) 10/04/16 1852 127/70      Heart Rate 10/04/16 1852 108      Respirations 10/04/16 1852 18      Temperature 10/04/16 1852 99 F (37.2 C)      Temp src --       SpO2 10/04/16 1852 99 %      Weight 10/04/16 1852 196 lb 7 oz (89.1 kg)      Height --       Head Cir --       Peak Flow --       Pain Score --       Pain Loc --       Pain Edu? --       Excl. in [place]? --       Vital signs noted. Tachycardic, afebrile O2 sat  99%  on room air that is within normal limits.  Constitutional: Alert, oriented, ill-appearing female  Eyes: Conjunctivae and EOM are normal.  scleral icterus.   ENMT: Oropharynx is clear and dry  Neck: Normal ROM. No mass.  CV: tachyRR. No murmurs. Strong symmetric radial and DP pulses.  Resp: Effort normal and breath sounds normal.   GI: Soft.  Distended, large palpable right-sided mass  MSK: Normal range of motion. No deformity. No focal tenderness. Bilateral LE pitting edema to knees.  Neuro: Alert and oriented. No cranial nerve deficit. MAEW. Normal coordination.  Skin: Skin is warm and dry. No rash.  Psychiatric: Normal mood and affect. Appropriate insight/judgement    Assessment: incr abd pain, girth, worsening jaundice, new pale stools, dark urine concerning for progressively worsening liver fx in setting of known large  HCC.     ddx includes PV thrombosis, infection, obstruction, uti/pyelo    Plan: symptomatic treatment  Medications   HYDROmorphone (DILAUDID) injection 1 mg (1 mg IntraVENOUS Given 10/04/16 2140)     Orders Placed This Encounter   Procedures  US Abdomen Limited    CBC w/ Diff Lavender    Comprehensive Metabolic Panel Green Plasma Separator Tube    Magnesium, Blood Green Plasma Separator Tube    Phosphorus, Blood Green Plasma Separator Tube    Lactate, Blood Green Plasma Separator Tube    Urinalysis with Culture Reflex, when indicated    Lipase, Blood Green Plasma Separator Tube    Prothrombin Time, Blood Blue    aPTT, Blood Blue    MDIFF        Medical Records Reviewed - summ above    Last CT a/p 09/14/16   IMPRESSION:  CT scan of the abdomen and pelvis with IV contrast    17 cm mass involving the right lobe of the liver and segment 4 consistent with known hepatocellular carcinoma which significantly encases and displaces the portal veins, hepatic veins and IVC but no evidence of thrombus at this time. The mass has increased in size from 11 cm on the prior CT. Small amount of adjacent  ascites. Small amount of perihepatic lymphadenopathy. This mass likely accounts for the patient's right flank pain.    LABS:   Results for orders placed or performed during the hospital encounter of 10/04/16   CBC w/ Diff Lavender   Result Value Ref Range    WBC 14.4 (H) 4.0 - 10.0 1000/mm3    RBC 3.34 (L) 3.90 - 5.20 mill/mm3    Hgb 9.2 (L) 11.2 - 15.7 gm/dL    Hct 31.9 (L) 34.0 - 45.0 %    MCV 95.5 (H) 79.0 - 95.0 um3    MCH 27.5 26.0 - 32.0 pgm    MCHC 28.8 (L) 32.0 - 36.0 g/dL    RDW 22.1 (H) 12.0 - 14.0 %    MPV 8.9 (L) 9.4 - 12.4 fL    Plt Count 457 (H) 140 - 370 1000/mm3    Absolute Nucleated RBC 0.1 <0.1 1000/mm3    NRBC 1 <1 /100 WBC    Segs 68 %    Lymphocytes 11 %    Monocytes 8 %    Eosinophils 3 %    ANC-Manual Mode 10.4 (H) 1.6 - 7.0 1000/mm3    Abs Lymphs 1.6 0.8 - 3.1 1000/mm3    Abs Monos 1.2 (H) 0.2 - 0.8 1000/mm3    Abs Eosinophils 0.4 <0.1 - 0.5 1000/mm3    Diff Type Manual    Comprehensive Metabolic Panel Green Plasma Separator Tube   Result Value Ref Range    Glucose 129 (H) 70 - 99 mg/dL    BUN 8 6 - 20 mg/dL    Creatinine 0.51 0.51 - 0.95 mg/dL    GFR >60 mL/min    Sodium 133 (L) 136 - 145 mmol/L    Potassium 3.5 3.5 - 5.1 mmol/L    Chloride 94 (L) 98 - 107 mmol/L    Bicarbonate 23 22 - 29 mmol/L    Anion Gap 16 (H) 7 - 15 mmol/L    Calcium 9.3 8.5 - 10.6 mg/dL    Total Protein 7.3 6.0 - 8.0 g/dL    Albumin 2.8 (L) 3.5 - 5.2 g/dL    Bilirubin, Tot 15.42 (H) <1.2 mg/dL    AST (SGOT) 78 (H) 0 - 32 U/L    ALT (SGPT) 39 (H) 0 - 33 U/L    Alkaline Phos 815 (H) 35 - 140 U/L   Magnesium, Blood Green Plasma Separator Tube   Result Value Ref Range    Magnesium 2.1 1.6 -  2.6 mg/dL   Phosphorus, Blood Green Plasma Separator Tube   Result Value Ref Range    Phosphorous 2.9 2.7 - 4.5 mg/dL   Lactate, Blood Green Plasma Separator Tube   Result Value Ref Range    Lactate 2.4 (H) 0.5 - 2.0 mmol/L   Urinalysis with Culture Reflex, when indicated   Result Value Ref Range    Type Not Specified     Color Amber  Yellow    Appearance Slightly Hazy Clear    Specific Gravity 1.019 1.002 - 1.030    pH 6.0 5.0 - 8.0    Protein 3+ (A) Negative    Glucose 1+ (A) Negative    Ketones Negative Negative    Bilirubin 2+ (A) Negative    Blood Negative Negative    Urobilinogen 2+ (A) Negative    Nitrite Negative Negative    Leuk Esterase Negative Negative    WBC 3-5 (A) 0-2/HPF    RBC 0-2 0-2/HPF    Bacteria Rare None-Rare/HPF    Squam. Epithelial Cell 6-10(FEW) (A) <6-10(FEW)    Mucus Few (A) None-Rare/HPF   Lipase, Blood Green Plasma Separator Tube   Result Value Ref Range    Lipase 36 13 - 60 U/L   Prothrombin Time, Blood Blue   Result Value Ref Range    PT,Patient 14.1 (H) 9.7 - 12.5 sec    INR 1.3    aPTT, Blood Blue   Result Value Ref Range    PTT 36 (H) 25 - 34 sec   MDIFF   Result Value Ref Range    Bands 4 0 - 15 %    Metamyelocytes 4 %    Myelocytes 2 %    Number of Cells Counted 115     Immature Granulocytes Absolute Manual 0.9 (H) 0.0 - 0.1 1000/mm3    Plt Est Increased     RBC Comment Present     Microcytic 1+     Macrocytic 1+     Polychromasia 2+     Stomatocytes 1+      ED Course: leukocytosis. Notable worsening in liver fx now w/ hyperbili to 15 (prev 5) concern for obstructive pathology likely 2/2 [place].  Lactate equivocal. Zosyn given coverage  Urine not c/w infection.      Admit medicine Dr. Willow Ora. GI consultation as inpatient     Dwaine Deter, MD  10/05/16 [phone]

## 2016-10-05 DIAGNOSIS — C220 Liver cell carcinoma: Principal | ICD-10-CM

## 2016-10-05 LAB — BASIC METABOLIC PANEL, BLOOD
Anion Gap: 18 mmol/L — ABNORMAL HIGH (ref 7–15)
BUN: 7 mg/dL (ref 6–20)
Bicarbonate: 21 mmol/L — ABNORMAL LOW (ref 22–29)
Calcium: 9.3 mg/dL (ref 8.5–10.6)
Chloride: 93 mmol/L — ABNORMAL LOW (ref 98–107)
Creatinine: 0.44 mg/dL — ABNORMAL LOW (ref 0.51–0.95)
GFR: >60 mL/min
Glucose: 92 mg/dL (ref 70–99)
Potassium: 3.4 mmol/L — ABNORMAL LOW (ref 3.5–5.1)
Sodium: 132 mmol/L — ABNORMAL LOW (ref 136–145)

## 2016-10-05 LAB — CBC WITH DIFF, BLOOD
ANC-Automated: 8.5 10*3/uL — ABNORMAL HIGH (ref 1.6–7.0)
Abs Basophils: 0.1 10*3/uL (ref <0.1)
Abs Eosinophils: 0.1 10*3/uL (ref 0.1–0.5)
Abs Lymphs: 1.2 10*3/uL (ref 0.8–3.1)
Abs Monos: 1 10*3/uL — ABNORMAL HIGH (ref 0.2–0.8)
Absolute Nucleated RBC: 0.1 10*3/uL (ref <0.1)
Basophils: 1 %
Eosinophils: 1 %
Hct: 30.6 % — ABNORMAL LOW (ref 34.0–45.0)
Hgb: 8.9 gm/dL — ABNORMAL LOW (ref 11.2–15.7)
Imm Gran %: 8 % — ABNORMAL HIGH (ref <1)
Imm Gran Abs: 0.9 10*3/uL — ABNORMAL HIGH (ref <0.1)
Lymphocytes: 10 %
MCH: 28.6 pg (ref 26.0–32.0)
MCHC: 29.1 g/dL — ABNORMAL LOW (ref 32.0–36.0)
MCV: 98.4 um3 — ABNORMAL HIGH (ref 79.0–95.0)
MPV: 9.4 fL (ref 9.4–12.4)
Monocytes: 9 %
NRBC: 1 /100 WBC (ref <1)
Plt Count: 415 10*3/uL — ABNORMAL HIGH (ref 140–370)
RBC: 3.11 10*6/uL — ABNORMAL LOW (ref 3.90–5.20)
RDW: 22.5 % — ABNORMAL HIGH (ref 12.0–14.0)
Segs: 72 %
WBC: 11.8 10*3/uL — ABNORMAL HIGH (ref 4.0–10.0)

## 2016-10-05 LAB — GLUCOSE (POCT)
Glucose (POCT): 94 mg/dL (ref 70–99)
Glucose (POCT): 80 mg/dL (ref 70–99)
Glucose (POCT): 78 mg/dL (ref 70–99)
Glucose (POCT): 102 mg/dL — ABNORMAL HIGH (ref 70–99)
Glucose (POCT): 108 mg/dL — ABNORMAL HIGH (ref 70–99)

## 2016-10-05 LAB — LIVER PANEL, BLOOD
ALT (SGPT): 42 U/L — ABNORMAL HIGH (ref 0–33)
AST (SGOT): 90 U/L — ABNORMAL HIGH (ref 0–32)
Albumin: 2.5 g/dL — ABNORMAL LOW (ref 3.5–5.2)
Alkaline Phos: 833 U/L — ABNORMAL HIGH (ref 35–140)
Bilirubin, Dir: 14.6 mg/dL — ABNORMAL HIGH (ref <0.2)
Bilirubin, Tot: 16.91 mg/dL — ABNORMAL HIGH (ref <1.2)
Total Protein: 7.4 g/dL (ref 6.0–8.0)

## 2016-10-05 LAB — PHOSPHORUS, BLOOD: Phosphorous: 3.1 mg/dL (ref 2.7–4.5)

## 2016-10-05 LAB — APTT, BLOOD: PTT: 39 s — ABNORMAL HIGH (ref 25–34)

## 2016-10-05 LAB — LACTATE, BLOOD
Lactate: 2.3 mmol/L — ABNORMAL HIGH (ref 0.5–2.0)
Lactate: 2 mmol/L (ref 0.5–2.0)

## 2016-10-05 LAB — BILIRUBIN, DIR BLOOD: Bilirubin, Dir: 11.2 mg/dL — ABNORMAL HIGH (ref <0.2)

## 2016-10-05 LAB — PROTHROMBIN TIME, BLOOD
INR: 1.3
PT,Patient: 13.9 s — ABNORMAL HIGH (ref 9.7–12.5)

## 2016-10-05 LAB — MAGNESIUM, BLOOD: Magnesium: 2 mg/dL (ref 1.6–2.6)

## 2016-10-05 MED ORDER — HYDROMORPHONE HCL 1 MG/ML IJ SOLN
1.00 mg | INTRAMUSCULAR | Status: AC | PRN
Start: 2016-10-05 — End: 2016-10-05
  Administered 2016-10-05 (×2): 1 mg via INTRAVENOUS
  Filled 2016-10-05 (×2): qty 1

## 2016-10-05 MED ORDER — DEXTROSE (DIABETIC USE) 40 % OR GEL
1.0000 | ORAL | Status: DC | PRN
Start: 2016-10-05 — End: 2016-10-08

## 2016-10-05 MED ORDER — LIDOCAINE 5 % EX PTCH
1.0000 | MEDICATED_PATCH | CUTANEOUS | Status: DC
Start: 2016-10-06 — End: 2016-10-08
  Administered 2016-10-06 – 2016-10-08 (×3): 1 via TRANSDERMAL
  Filled 2016-10-05 (×3): qty 1

## 2016-10-05 MED ORDER — GLUCOSE 4 GM PO CHEW (CUSTOM)
4.0000 | CHEWABLE_TABLET | ORAL | Status: DC | PRN
Start: 2016-10-05 — End: 2016-10-08

## 2016-10-05 MED ORDER — LACTATED RINGERS IV SOLN
Freq: Once | INTRAVENOUS | Status: AC
Start: 2016-10-05 — End: 2016-10-05
  Administered 2016-10-05: 01:00:00 via INTRAVENOUS

## 2016-10-05 MED ORDER — DEXTROSE-NACL 5-0.9 % IV SOLN (CUSTOM)
INTRAVENOUS | Status: DC
Start: 2016-10-05 — End: 2016-10-05
  Administered 2016-10-05: 14:00:00 via INTRAVENOUS

## 2016-10-05 MED ORDER — ACETAMINOPHEN 650 MG RE SUPP
650.0000 mg | RECTAL | Status: DC | PRN
Start: 2016-10-05 — End: 2016-10-08

## 2016-10-05 MED ORDER — ACETAMINOPHEN 325 MG PO TABS
650.0000 mg | ORAL_TABLET | ORAL | Status: DC | PRN
Start: 2016-10-05 — End: 2016-10-08

## 2016-10-05 MED ORDER — SODIUM CHLORIDE 0.9% TKO INFUSION
INTRAVENOUS | Status: DC | PRN
Start: 2016-10-05 — End: 2016-10-08

## 2016-10-05 MED ORDER — TRAZODONE HCL 50 MG OR TABS
50.0000 mg | ORAL_TABLET | Freq: Every evening | ORAL | Status: DC | PRN
Start: 2016-10-05 — End: 2016-10-08

## 2016-10-05 MED ORDER — HYDROMORPHONE HCL 4 MG OR TABS
8.00 mg | ORAL_TABLET | Freq: Once | ORAL | Status: AC
Start: 2016-10-05 — End: 2016-10-05
  Administered 2016-10-05: 8 mg via ORAL
  Filled 2016-10-05: qty 2

## 2016-10-05 MED ORDER — ALUM & MAG HYDROXIDE-SIMETH 200-200-20 MG/5ML OR SUSP
30.0000 mL | Freq: Four times a day (QID) | ORAL | Status: DC | PRN
Start: 2016-10-05 — End: 2016-10-08

## 2016-10-05 MED ORDER — SENNA 8.6 MG OR TABS
2.0000 | ORAL_TABLET | Freq: Every evening | ORAL | Status: DC
Start: 2016-10-05 — End: 2016-10-08
  Administered 2016-10-05 – 2016-10-07 (×3): 17.2 mg via ORAL
  Filled 2016-10-05 (×3): qty 2

## 2016-10-05 MED ORDER — ONDANSETRON HCL 4 MG/2ML IV SOLN
4.0000 mg | Freq: Four times a day (QID) | INTRAMUSCULAR | Status: DC | PRN
Start: 2016-10-05 — End: 2016-10-05

## 2016-10-05 MED ORDER — HYDROMORPHONE HCL 2 MG OR TABS
2.0000 mg | ORAL_TABLET | ORAL | Status: DC | PRN
Start: 2016-10-05 — End: 2016-10-08

## 2016-10-05 MED ORDER — HYDROXYZINE HCL 25 MG OR TABS
25.0000 mg | ORAL_TABLET | ORAL | Status: DC | PRN
Start: 2016-10-05 — End: 2016-10-08
  Administered 2016-10-06 – 2016-10-08 (×3): 25 mg via ORAL
  Filled 2016-10-05 (×3): qty 1

## 2016-10-05 MED ORDER — NALOXONE HCL 0.4 MG/ML IJ SOLN
0.1000 mg | INTRAMUSCULAR | Status: DC | PRN
Start: 2016-10-05 — End: 2016-10-08

## 2016-10-05 MED ORDER — ACETAMINOPHEN 325 MG PO TABS
650.0000 mg | ORAL_TABLET | Freq: Four times a day (QID) | ORAL | Status: DC | PRN
Start: 2016-10-05 — End: 2016-10-08

## 2016-10-05 MED ORDER — GLUCAGON HCL (RDNA) 1 MG IJ SOLR
1.0000 mg | Freq: Once | INTRAMUSCULAR | Status: DC | PRN
Start: 2016-10-05 — End: 2016-10-08

## 2016-10-05 MED ORDER — SODIUM CHLORIDE 0.9 % IJ SOLN (CUSTOM)
3.0000 mL | INTRAMUSCULAR | Status: DC | PRN
Start: 2016-10-05 — End: 2016-10-08

## 2016-10-05 MED ORDER — HYDROMORPHONE HCL 2 MG OR TABS
2.0000 mg | ORAL_TABLET | ORAL | Status: DC | PRN
Start: 2016-10-05 — End: 2016-10-05
  Administered 2016-10-05 (×2): 2 mg via ORAL
  Filled 2016-10-05 (×2): qty 1

## 2016-10-05 MED ORDER — SODIUM CHLORIDE 0.9 % IJ SOLN (CUSTOM)
3.0000 mL | Freq: Three times a day (TID) | INTRAMUSCULAR | Status: DC
Start: 2016-10-05 — End: 2016-10-08
  Administered 2016-10-05 – 2016-10-07 (×7): 3 mL via INTRAVENOUS

## 2016-10-05 MED ORDER — HYDROMORPHONE HCL 1 MG/ML IJ SOLN
1.0000 mg | INTRAMUSCULAR | Status: DC | PRN
Start: 2016-10-05 — End: 2016-10-05

## 2016-10-05 MED ORDER — INSULIN LISPRO (HUMAN) 100 UNIT/ML SC SOLN (CUSTOM)
1.0000 [IU] | Freq: Four times a day (QID) | INTRAMUSCULAR | Status: DC
Start: 2016-10-05 — End: 2016-10-08

## 2016-10-05 MED ORDER — SODIUM CHLORIDE 0.9 % IV SOLN
INTRAVENOUS | Status: DC
Start: 2016-10-05 — End: 2016-10-05
  Administered 2016-10-05 (×2): via INTRAVENOUS

## 2016-10-05 MED ORDER — ONDANSETRON HCL 4 MG/2ML IV SOLN
4.0000 mg | Freq: Four times a day (QID) | INTRAMUSCULAR | Status: DC | PRN
Start: 2016-10-05 — End: 2016-10-08

## 2016-10-05 MED ORDER — ACETAMINOPHEN 325 MG PO TABS
650.0000 mg | ORAL_TABLET | Freq: Four times a day (QID) | ORAL | Status: DC | PRN
Start: 2016-10-05 — End: 2016-10-05

## 2016-10-05 MED ORDER — HYDROMORPHONE HCL 4 MG OR TABS
8.0000 mg | ORAL_TABLET | ORAL | Status: DC | PRN
Start: 2016-10-05 — End: 2016-10-08
  Administered 2016-10-06 – 2016-10-08 (×8): 8 mg via ORAL
  Filled 2016-10-05 (×8): qty 2

## 2016-10-05 MED ORDER — MAGNESIUM HYDROXIDE 400 MG/5ML OR SUSP
30.0000 mL | Freq: Every evening | ORAL | Status: DC | PRN
Start: 2016-10-05 — End: 2016-10-08

## 2016-10-05 MED ORDER — BISACODYL 10 MG RE SUPP
10.0000 mg | Freq: Every day | RECTAL | Status: DC | PRN
Start: 2016-10-05 — End: 2016-10-08

## 2016-10-05 MED ORDER — ENOXAPARIN SODIUM 40 MG/0.4ML SC SOLN
40.0000 mg | Freq: Every day | SUBCUTANEOUS | Status: DC
Start: 2016-10-06 — End: 2016-10-06
  Administered 2016-10-06: 40 mg via SUBCUTANEOUS
  Filled 2016-10-05: qty 1

## 2016-10-05 MED ORDER — DEXTROSE 50 % IV SOLN
12.5000 g | INTRAVENOUS | Status: DC | PRN
Start: 2016-10-05 — End: 2016-10-08

## 2016-10-05 MED ORDER — PIPERACILLIN-TAZOBACTAM IN D5W 3.375 G PREMIX (ED)
3375.00 mg | Freq: Once | INTRAVENOUS | Status: AC
Start: 2016-10-05 — End: 2016-10-05
  Administered 2016-10-05: 3375 mg via INTRAVENOUS
  Filled 2016-10-05: qty 50

## 2016-10-05 MED ORDER — HYDROMORPHONE HCL 4 MG OR TABS
4.0000 mg | ORAL_TABLET | ORAL | Status: DC | PRN
Start: 2016-10-05 — End: 2016-10-05
  Administered 2016-10-05: 4 mg via ORAL
  Filled 2016-10-05: qty 1

## 2016-10-05 MED ORDER — POLYETHYLENE GLYCOL 3350 OR PACK
17.0000 g | PACK | Freq: Every day | ORAL | Status: DC
Start: 2016-10-06 — End: 2016-10-08
  Administered 2016-10-06 – 2016-10-08 (×3): 17 g via ORAL
  Filled 2016-10-05 (×3): qty 1

## 2016-10-05 MED ORDER — ACETAMINOPHEN 160 MG/5ML OR SOLN
650.0000 mg | ORAL | Status: DC | PRN
Start: 2016-10-05 — End: 2016-10-08

## 2016-10-05 NOTE — ED Floor Report (Signed)
ED to IP Handoff    Report created by Lenor Coffin, RN at 12:47 AM 10/05/2016.     HANDOFF REPORT UPDATE/CHANGES (changes in patient status/care/events prior to transfer)  By who:  Time:   Additional information:                                                                                                                                                     Laurie [name] is a 51 year old female.    Brief Summary of ED Visit (to include focused assessment and neuro status):  Patient is spanish speaking. Alert and oriented x 4. Came to ED with worsening abdominal pain. Taking pain meds at home with little effect. Given IV Dilaudid in ED and pain has improved. Found to have hyperbilirubinemia. Abdomen distended.     RN shift assessment exceptions to WDL: see note    Any significant events and interventions with responses:  See note    Radiologic studies not completed: none  (None unless otherwise noted)    Chief Complaint   Patient presents with    Abdominal Pain     Pt c/o increased right abd pain since Friday. Daughter states pain has never been this bad and pain medication is providing minimal relief       Admitted for: hyperbilirubinemia     Code Status:  Please refer to In-pt admitting doctors orders     Level of Care: med surg     Is patient septic? no If yes, complete below:    BC x 2 drawn? no  If No explain:      Repeat lactate needed? yes  If Yes, when is it due?  0150    All initial antibiotics given?  yes  If No, explain:      Amount of IV fluids received 1000 ml    Is patient on Heparin? no If yes, complete below:     Time Heparin bolus was given: n/a    Additional drips patient is on: none    Cardiac rhythm:     Oxygen Delivery: None    Past Medical History:   Diagnosis Date    DM II (diabetes mellitus, type II), controlled (CMS-HCC) 07/23/2016    GERD (gastroesophageal reflux disease)     Hepatocellular carcinoma (CMS-HCC)     Hypertension        Past Surgical History:   Procedure  Laterality Date    CESAREAN SECTION, LOW TRANSVERSE      times 3    CHOLECYSTECTOMY  2015       Allergies: Review of patient's allergies indicates no known allergies.    ED Fall Risk: No    Skin issues:  no    >> If yes, note areas of skin breakdown. See appropriate photos.  Ambulatory:  yes    Sitter needed: no    Suicide Risk:  no    Isolation Required: no     >> If yes , what type of isolation: n/a    Is patient in custody?  no    Is patient in restraints? no    Vitals:    10/04/16 1852 10/04/16 2235   BP: 127/70 126/67   BP Location:  Right arm   BP Patient Position:  Sitting   Pulse: 108 110   Resp: 18 18   Temp: 99 F (37.2 C) 98.8 F (37.1 C)   SpO2: 99% 96%   Weight: 89.1 kg (196 lb 7 oz)        Tubes Collected: Blue, Red, Yellow SST, Green PST, Lavender, Purple (Blood Bank), Green PST on Ice (10/04/16 2040 : Bobetta Lime, RN)    Lab Results   Component Value Date    WBC 14.4 (H) 10/04/2016    RBC 3.34 (L) 10/04/2016    HGB 9.2 (L) 10/04/2016    HCT 31.9 (L) 10/04/2016    MCV 95.5 (H) 10/04/2016    MCHC 28.8 (L) 10/04/2016    RDW 22.1 (H) 10/04/2016    PLT 457 (H) 10/04/2016    MPV 8.9 (L) 10/04/2016       Lab Results   Component Value Date    NA 133 (L) 10/04/2016    K 3.5 10/04/2016    CL 94 (L) 10/04/2016    BICARB 23 10/04/2016    BUN 8 10/04/2016    CREAT 0.51 10/04/2016    GLU 129 (H) 10/04/2016    Bude 9.3 10/04/2016       Lab Results   Component Value Date    PHOS 2.9 10/04/2016    MG 2.1 10/04/2016    LACTATE 2.4 (H) 10/04/2016       No results found for: CPK, CKMBH, TROPONIN    No results found for: PH, PCO2, O2CONTENT, IVHC3, IVBE, O2SAT, UNPH, UNPCO2, ARTPH, ARTPCO2, ARTO2CNT, IAHC3, IABE, ARTO2SAT, UNAPH, UNAPCO2    No results found for this visit on 10/04/16.      Patient Lines/Drains/Airways Status    Active PICC Line / CVC Line / PIV Line / Drain / Airway / Intraosseous Line / Epidural Line / ART Line / Line Type / Wound     Name: Placement date: Placement time: Site: Days:     Peripheral IV - 20 G Left Antecubital 10/04/16   2123   Antecubital   less than 1                    ED Handoff Report is ready for review.  Admitting RN may reach Emergency Department RN, Lenor Coffin, RN, at [phone] with any questions.

## 2016-10-05 NOTE — Interdisciplinary (Signed)
Received report from [place], [place] at approximately [phone] in ED.  Patient was accepted to unit at approximately 0350 via wheelchair, in stable condition with stable VSS.  IVF started.

## 2016-10-05 NOTE — Consults (Signed)
Hepatology Initial Consult Note Attestation  Consult Attending: Amalia Greenhouse, MD, MD  Consult Fellow:    Date: 10/05/16    Patient Name: Laurie [name] , MRN: [id number]    Hospital Day:   0 days - Admitted on: 10/04/2016    Reason for consultation: Assessment of [place] and cirrhosis, new onset jaundice  Chief Complaint: new onset jaundice    Past Medical History:  1) DMII - no known end-organ damage  2) HTN  3) GERD  4) HCC - diagnosed in 07/2016 after presenting with painless jaundice.    -Bx consistent with moderately differentiated [place]   -Patient scheduled for TABE in early March 2018, but this was deferred/cancelled by the patient due to insurance and funding issues      Past Surgical History:   Past Surgical History:   Procedure Laterality Date    CESAREAN SECTION, LOW TRANSVERSE      times 3    CHOLECYSTECTOMY  2015       Allergies:  No Known Allergies    Home Medications:  Prescriptions Prior to Admission   Medication Sig Dispense Refill Last Dose    acetaminophen (TYLENOL) 325 MG tablet Take 2 tablets (650 mg) by mouth every 6 hours as needed (pain). 60 tablet 0 Not Taking    glimepiride (AMARYL) 4 MG tablet Take 4 mg by mouth every morning.   Not Taking    HYDROmorphone (DILAUDID) 2 MG tablet Take 1 tablet (2 mg) by mouth every 3 hours as needed for Severe Pain (Pain Score 7-10). 30 tablet 0 Taking    losartan (COZAAR) 25 MG tablet Take 25 mg by mouth daily.   Not Taking    metFORMIN (GLUCOPHAGE) 1000 MG tablet Take 1,000 mg by mouth 2 times daily (with meals).   Taking    omeprazole (PRILOSEC) 20 MG capsule Take 20 mg by mouth daily.   Taking    ondansetron (ZOFRAN) 8 MG tablet Take 1 tablet (8 mg) by mouth every 8 hours as needed for Nausea/Vomiting. 30 tablet 0 Not Taking    pioglitazone (ACTOS) 30 MG tablet Take 30 mg by mouth daily.   Taking    traZODone (DESYREL) 50 MG tablet Take 1 tablet (50 mg) by mouth At bedtime as needed for Insomnia. 30 tablet 0 Not Taking       Inpatient  Medications:  Scheduled Meds   insulin lispro  1-10 Units 4x Daily WC    sodium chloride (PF)  3 mL Q8H     IV Meds   sodium chloride      sodium chloride 125 mL/hr at 10/05/16 1221       History of Present Illness: I have reviewed the history.     Laurie [name] Laurie [name] is a 51 year old female admitted for increasing abdominal girth and abdo pain x 1 week with increasing jaundice, in the setting of known HCC.    Laurie [name] initially presented to hospital [facility]) in late January/early February 2018 with painless jaundice and was found to have an elevated ALP. Imaging showed a 10.5x11.3cm lesion in the Rt lobe of the liver, with features consistent with HCC, but without known cirrhosis. She underwent a liver Bx of this mass, and this showed moderately differentiated HCC. She was presented at [facility] rounds, and the decision was made to proceed with a TABE with Dr. Sharlyne Cai. However, owing to insurance reasons, she was unable to keep this appointment, and therefore did not undergo any  treatment of this lesion. She has been seen by both medical oncology and surgical oncology, and she was not felt to be a candidate for resection, owing to the size and her increased bilirubin.     About 1-2 weeks ago, she started noting an increase in her abdominal girth and in pedal edema. She denies any features of GIB, or of HE (either overt or minimal). She has noted an increase in her weight, although until a few weeks ago, she had lost about 20-25lbs. She denies any night sweats, or any F/C. She has been having RUQ pain intermittently, but this has been increasing in frequency. She takes occasional oxycodone for the pain. She denies any features of cholangitis. She has been experiencing increasing fatigue.       Family History:  Family History   Problem Relation Age of Onset    Hypertension Mother     Dementia Father     Diabetes Father     No Known Problems Daughter     No Known Problems Maternal Grandmother      No Known Problems Maternal Grandfather     No Known Problems Paternal Grandmother     No Known Problems Paternal Grandfather     Diabetes Sister     No Known Problems Brother     No Known Problems Daughter     No Known Problems Daughter     No Known Problems Brother     No Known Problems Brother     Diabetes Sister     No Known Problems Sister     No Known Problems Sister    No known liver disease or cancer in the family.     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        Review of Systems: -   Constitutional: Denies fevers, chills  ENT: negative for eye changes, oral lesions, difficulty swallowing  Cardiac: Denies chest pain or palpitations  Lungs: Denies cough or shortness of breath  GI: Negative except noted in the HPI  GU: Denies dysuria, hematuria  Skin: Denies rashes or other skin lesions  MSK: Denies joint deformities, new joint pain  Lymph: Denies enlarged nodes, night sweats, abnormal bleeding  Psych: Some anxiety regarding diagnosis  Neuro: negative for confusion, weakness, sensory changes    OBJECTIVE:       Vitals signs:     Latest Entry  Range (last 24 hours)    Temperature: 98.2 F (36.8 C)  Temp  Avg: 98.4 F (36.9 C)  Min: 97.6 F (36.4 C)  Max: 99 F (37.2 C)    Blood pressure (BP): 125/65  BP  Min: 124/71  Max: 134/71    Heart Rate: 95  Pulse  Avg: 98  Min: 88  Max: 110    Respirations: 16  Resp  Avg: 17.3  Min: 16  Max: 18    SpO2:  97 %  SpO2  Avg: 96.7 %  Min: 95 %  Max: 99 %       No Data Recorded     Weight: 89.8 kg (197 lb 14.4 oz)  Percentage Weight Change (%): 0.74  %    Intake/Output       10/04/16 0600 - 10/05/16 0559 10/05/16 0600 - 10/06/16 0559      [id number] 9381-0175 Total 0600-1759 1025-8527 Total       Intake    I.V.  --  1079.2 1079.2  --  -- --    Total Intake -- 1079.2 1079.2 -- -- --       Output    Total Output -- -- -- -- -- --       Net I/O     -- 1079.2 1079.2 -- -- --            Respiratory support:  Oxygen Therapy  SpO2: 97 %  O2 Device: None (Room air)       Physical Exam:  General Appearance: healthy, alert, no distress, pleasant affect, cooperative.  Eyes:  conjunctivae and corneas clear. PERRL, EOM's intact. Scleral icterus present  Heart:  normal rate and regular rhythm, no murmurs, clicks, or gallops.  Lungs: clear to auscultation and percussion, no chest deformities noted.  Abdomen: Abdomen soft, tender to deep palpation of the RUQ; negative Murphy's sign; no rebound or guarding. No masses or organomegaly. Bowel sounds normal. Abdomen distended, but no fluid wave.   Extremities:  no cyanosis, clubbing. peripheral edema 1+ bilaterally to shins.  Skin:  Skin color, texture, turgor normal. No rashes or lesions. Patient is deeply jaundiced.    Laboratory data:  Labs reviewed:  Lab Results   Component Value Date    K 3.4 (L) 10/05/2016    CL 93 (L) 10/05/2016    BICARB 21 (L) 10/05/2016    BUN 7 10/05/2016    CREAT 0.44 (L) 10/05/2016    GLU 92 10/05/2016    [place] 9.3 10/05/2016     Lab Results   Component Value Date    WBC 11.8 (H) 10/05/2016    HGB 8.9 (L) 10/05/2016    HCT 30.6 (L) 10/05/2016    PLT 415 (H) 10/05/2016     Lab Results   Component Value Date    AST 90 (H) 10/05/2016    ALT 42 (H) 10/05/2016    ALK 833 (H) 10/05/2016    TBILI 16.91 (H) 10/05/2016    DBILI 14.6 (H) 10/05/2016    TP 7.4 10/05/2016    ALB 2.5 (L) 10/05/2016     Lab Results   Component Value Date    INR 1.3 10/05/2016    PTT 39 (H) 10/05/2016         Radiology:   Abdo U/S (10/04/2016): LIVER:  21 cm in long axis. Heterogeneous mass involving the right and left liver lobes measuring  approximately 15.9 x 15.4 x 13.3 cm, consistent with known hepatocellular carcinoma.  VESSELS:  Main portal vein diameter: 34m in the porta hepatis. Main portal vein flow: Patent with proper direction.  Right portal vein flow: Patent with proper direction. Left portal vein flow: Patent with properdirection. Hepatic veins: Patent  BILIARY: No intra or extra hepatic bile duct dilation.  OTHER: No ascites.  Spleen: Measures 10.7 cm.    CT Abdo/pelvis (09/14/2016):LUNG BASES: Please see today's chest CT.  LIVER/BILIARY:There is a large mass encompassing nearly the entirety of the right lobe of the liver including  segment 4 as well. It measures at least 17.4 by 13.4 by 16.3 cm. The mass displaces the left portal vein and significantly attenuates the right portal vein but they do appear patent. Hepatic veins are significantly compressed by this mass as well. Status post cholecystectomy. Mild inflammation and trace ascites adjacent to the lateral liver.  PANCREAS: Unremarkable  SPLEEN: Unremarkable.  ADRENAL GLANDS: Unremarkable  KIDNEYS: Right lower pole cyst.  STOMACH/DUODENUM: Unremarkable  VASCULATURE: Minimal atherosclerotic calcifications.  LYMPHATIC: Multiple perihepatic and peripancreatic lymph nodes largest of which measures 1 cm in short axis.  SMALL & LARGE BOWEL: Unremarkable  BLADDER/PELVIC ORGANS: Calcified mesenteric lymph node in the right hemipelvis.  BONES/SOFT TISSUES: Fat containing umbilical and ventral wall hernias.  OTHER: None      CT chest (09/14/2016):  Cardiovascular: Unremarkable.  Pericardium: No significant effusion.  Lymph nodes: No enlarged intrathoracic nodes identified.  Airways: Relatively unremarkable.  Pleura: No effusion or pneumothorax.  Lungs: No consolidation. New nodular opacities in the lungs which measure up to 6 mm. Stable appearance of calcified granuloma in the left lower lobe.  Imaged upper abdomen: Elevated right hemidiaphragm and large liver mass. Please see concurrently  performed CT abdomen/pelvis.  Regional skeleton: No acute findings.  Chest wall: No acute osseous abnormalities.      Prior Endoscopy:   EGD (08/20/2016): No Varices seen  Z-line regular at approx. 38 cm from central incisors.  Stomach with mild, diffuse erythema and altered texture. Biopsies obtained in the gastric antrum and body to r/o H.pylori. Normal retroflexion in the cardia and fundus  Normal examined portion of duodenum     Colonoscopy (08/20/2016): Mild internal and external hemorrhoids in the rectum, seen upon retroflexion  Tortuous left colon  Exam otherwise normal    ASSESSMENT / PLAN:     Assessment and plan reviewed with the fellow. I agree with the fellows care plan, with the following summary and additional notes:    1)HCC: NaMELD 24, CP B9   -Given increase in MELD, patient is no longer a candidate for TABE (contraindicated when MELD is this high, as it can precipitate liver failure)   -Given size of hepatic lesion and the presence of possible lung mets on April 2 imaging of chest, she is not a transplant candidate   -Will discuss case with MedOnc and IR to assess for other treatment options. As patient is a Childs-Pugh B, she is a marginal candidate for systemic chemotherapy.   -Recommend repeat imaging of chest (CT) as well as MRI/MRCP to assess biliary tree. If there is biliary compression, then can consider decompression with external biliary drainage.    -Given no obvious extrahepatic biliary obstruction on U/S, there is limited, if any, role in an ERCP.    -Trial of Ursodiol 47m/kg to see if this can bring down her Bilirubin    2) Goals of Care - Highly likely that patient will not be a candidate for treatment    -Once imaging is back, recommend discussing with social work/case management regarding eligibility for care   -We will discuss options with patient once MRCP is back    See the fellow note for further details.      Electronically Signed by:   SAmalia Greenhouse MD, [facility]  Attending  10/05/2016  12:58 PM

## 2016-10-05 NOTE — Interdisciplinary (Signed)
10/05/16 1344   Patient Information   Where was the patient admitted from? Home ED admission    Why is Patient in the Hospital? Abdominal Pain /o increasing in chronic abd pain x 3 days more severe than in past,   Prior to Level of Function Ambulatory/Independent with ADL's   Primary Contact Name [facility]   Primary Contact Number [phone]   Primary Contact Relationship Child   Permission to Contact Yes   Secondary Contact Name [place]   Secondary Contact Relationship Child   Income [place] Unemployed   Referral To   Financial Resources Financial counseling   Discharge Planning   Type of Residence Private residence   Anticipated Supplies/Services  Too soon to be determined   Barriers to Discharge Awaiting consult input;Awaiting clinical improvement   Patient/Family Engaged in Discharge Planning Yes   Patient Has Decision Making Capacity Yes   Plan/Interventions   Plan/Interventions Explore needs and options for aftercare, provide referrals   Social Worker Consult   Do you need to see a social worker?  No   Readmission Risk Assessment   Readmission Within 30 Days of Discharge Yes   Patient Explanation  Got sicker   Recent Hospitalizations (Within Last 6 Months) Yes   High Risk For Readmission Yes   High Risk Indicators History of multiple hospital admissions;Multiple diagnoses and comorbidities   Initial Assessment Completed   CM Initial Assessment Completed   If Medicare or Medicare Managed Care: Important Message from Medicare Given   If Medicare or Medicare Managed Care: Important Message from Medicare Given Not Applicable     CM completed the assessment via a chart review.

## 2016-10-05 NOTE — Interdisciplinary (Signed)
10/05/16 0725   Provider Notification   Provider Notified Physician   Provider Name Ghabour   Method of Communication Page   Reason for Communication RE: Laurie [name], Laurie [name] 369. Kindly reorder coag studies, sample was not enough and lab has to cancel it. Thank YOu

## 2016-10-05 NOTE — Progress Notes (Addendum)
ATTENDING PROGRESS NOTE     Subjective    States she has noticed increased pain along her right flank going to the right side of her back.  She increased her pain medication use starting three days prior to admission.      Objective  Temperature:  [97.6 F (36.4 C)-99 F (37.2 C)] 98.2 F (36.8 C) (04/23 1213)  Blood pressure (BP): (124-134)/(65-73) 125/65 (04/23 1213)  Heart Rate:  [88-110] 95 (04/23 1213)  Respirations:  [16-18] 16 (04/23 1213)  Pain Score: 6 (04/23 1300)  O2 Device: None (Room air) (04/23 1213)  SpO2:  [95 %-99 %] 97 % (04/23 1213)    Gen: Awake and alert  HEENT: +scleral icterus  CVS: RRR  Chest: Good air entry b/l lungs   Abdomen: +bs, obese, +ruq ttp, +distension  Ext: trace edema LE     WBC 11.8* (04/23) HGB 8.9* (04/23) PLT 415* (04/23)    HCT 30.6* (04/23)                  Na 132* (04/23) CL 93* (04/23) BUN 7 (04/23) GLU   92 (04/23)   K 3.4* (04/23) CO2 21* (04/23) Cr 0.44* (04/23)          Assessment and Plan  This is a 51 yo F with [place], DM2, HTN who presents who worsening abdominal pain and jaundice.    # Metastatic HCC/Hyperbilirubinemia   -Patient seen by oncology as an outpatient but unfortunately given social situation does not qualify for emergency coverage.  -Spoke with primary oncologist who is aware of admission  -MRCP  -Appreciate Hepatology input   -It is likely that the patient will not be a candidate for any further intervention.  -Will need to coordinate goals of care discussion with Hepatology   -After MRCP results and speaking with Hepatology will need to consider Palliative Care Evaluation    # Cancer Related Pain  -Lidocaine patch to right back/flank  -Dilaudid 2 mg po q4h prn moderate  -Dilaudid 4 mg po q4h prn severe     # Pruritis  -Hydroxyzine 25 mg po q2h prn     # Constipation  -Miralax 17 g po daily  -Senna 17.2 mg po qhs   -Bisacodyl supp daily prn    # DM2  -Carb limited diet  -Lispro sliding scale     # HTN  -Currently not on medications     # VTE  Prophylaxis  -Lovenox 40 mg sq daily

## 2016-10-05 NOTE — ED Notes (Signed)
Report to Marlene RN 3 W

## 2016-10-05 NOTE — ED Notes (Signed)
MD at bedside. 

## 2016-10-05 NOTE — H&P (Signed)
HISTORY AND PHYSICAL    Admission Date:  10/04/2016    Attending MD:   Dwaine Deter, MD    Primary Care Provider:  Maricela Curet    Chief Complaint:  Jaundice/Abdominal pain  History of Present Illness:     51 year old female with history of hypertension, diabetes and newly diagnosed hepatocellular carcinoma is presenting with a 3 day history with increasing abdominal pain and jaundice.    Dilaudid did not help relieve her pain. Also, patient had scleral icterus, dark color urine and light colored stool.    Denies dyspnea, chest pain and palpitations    Past Medical and Surgical History:  Past Medical History:   Diagnosis Date    DM II (diabetes mellitus, type II), controlled (CMS-HCC) 07/23/2016    GERD (gastroesophageal reflux disease)     Hepatocellular carcinoma (CMS-HCC)     Hypertension      Past Surgical History:   Procedure Laterality Date    CESAREAN SECTION, LOW TRANSVERSE      times 3    CHOLECYSTECTOMY  2015       Immunization History:  There is no immunization history for the selected administration types on file for this patient.    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        Family History:  Family History   Problem Relation Age of Onset    Hypertension Mother     Dementia Father     Diabetes Father     No Known Problems Daughter     No Known Problems Maternal Grandmother     No Known Problems Maternal Grandfather     No  Known Problems Paternal Grandmother     No Known Problems Paternal Grandfather     Diabetes Sister     No Known Problems Brother     No Known Problems Daughter     No Known Problems Daughter     No Known Problems Brother     No Known Problems Brother     Diabetes Sister     No Known Problems Sister     No Known Problems Sister        Review of Systems:  Complete ROS reviewed with patient and is negative. Pertinent ROS in HPI.      Physical Exam:  Temperature:  [98.8 F (37.1 C)-99 F (37.2 C)] 98.8 F (37.1 C) (04/22 2235)  Blood pressure (BP): (126-127)/(67-70) 126/67 (04/22 2235)  Heart Rate:  [108-110] 110 (04/22 2235)  Respirations:  [18] 18 (04/22 2235)  Pain Score: 8 (04/22 2235)  O2 Device: None (Room air) (04/22 2235)  SpO2:  [96 %-99 %] 96 % (04/22 2235)  General:  WDWN female in NAD, pleasant and cooperative  Head: NCAT, scleral icterus  Eye: PEARRL, EOMI  Neck: neck is supple, trachea midline  Respiratory:CTAB  Heart: RRR with normal s1/s2, no s3/s4, no m/r/g, peripheral pulses intact  Abdomen: soft and non distended with normal bowel sounds, percussion tympanic; NTTP post pain medications, no fluid wave or hepatosplenomegaly  Skin: warm and dry      Labs and Other Data:  Lab Results   Component Value Date    K 3.5 10/04/2016    CL 94 (L) 10/04/2016    BICARB 23 10/04/2016    BUN 8 10/04/2016    CREAT 0.51 10/04/2016    GLU 129 (H) 10/04/2016    [place] 9.3 10/04/2016     Lab Results   Component Value Date    WBC 14.4 (H) 10/04/2016    HGB 9.2 (L) 10/04/2016    HCT 31.9 (L) 10/04/2016    PLT 457 (H) 10/04/2016    SEG 68 10/04/2016    BAND 4 10/04/2016    LYMPHS 11 10/04/2016    MONOS 8 10/04/2016    EOS 3 10/04/2016    NRBC 1 10/04/2016     Lab Results   Component Value Date    AST 78 (H) 10/04/2016    ALT 39 (H) 10/04/2016    ALK 815 (H) 10/04/2016    TBILI 15.42 (H) 10/04/2016    TP 7.3 10/04/2016    ALB 2.8 (L) 10/04/2016     Lab Results   Component Value Date    INR 1.3 10/04/2016    PTT 36 (H)  10/04/2016       Assessment and Care Plan:  51 year old female with history of hypertension, diabetes and newly diagnosed hepatocellular carcinoma is presenting with a 3 day history with increasing abdominal pain and jaundice.    New [place]  -total bilirubin 15  -GI consult in AM  - likely related to mass  -NPO for potential procedure    Worsening abdominal pain  -Likely related to cancer progression  -IV dilaudid PRN    HTN    Hold Losartan    T2DM  SSI    This plan and alternatives have been discussed with the patient and/or surrogate.    Code Status:  Full Code    Note Author: Holley Dexter,   10/05/16, 12:04 AM      .

## 2016-10-05 NOTE — Interdisciplinary (Signed)
The patient had a Hepatology consult. In the assessmne /plan it was indicated that the patient no longer is a candidate for TABE. Also marginal candidate  For systemic chemotherapy.   The rec is to repeat the CT of the chest and a MRI/MRCP. CM will continue to follow for dc needs.

## 2016-10-05 NOTE — ED Notes (Signed)
Patient ambulated to bathroom with steady gate

## 2016-10-06 ENCOUNTER — Inpatient Hospital Stay (HOSPITAL_BASED_OUTPATIENT_CLINIC_OR_DEPARTMENT_OTHER): Payer: Self-pay

## 2016-10-06 ENCOUNTER — Inpatient Hospital Stay (HOSPITAL_COMMUNITY): Payer: Self-pay

## 2016-10-06 DIAGNOSIS — C7800 Secondary malignant neoplasm of unspecified lung: Secondary | ICD-10-CM

## 2016-10-06 DIAGNOSIS — K7460 Unspecified cirrhosis of liver: Secondary | ICD-10-CM

## 2016-10-06 DIAGNOSIS — C220 Liver cell carcinoma: Secondary | ICD-10-CM

## 2016-10-06 DIAGNOSIS — R601 Generalized edema: Secondary | ICD-10-CM

## 2016-10-06 DIAGNOSIS — K769 Liver disease, unspecified: Secondary | ICD-10-CM

## 2016-10-06 DIAGNOSIS — K7581 Nonalcoholic steatohepatitis (NASH): Secondary | ICD-10-CM

## 2016-10-06 LAB — CBC WITH DIFF, BLOOD
ANC-Automated: 11 10*3/uL — ABNORMAL HIGH (ref 1.6–7.0)
Abs Basophils: 0.1 10*3/uL (ref <0.1)
Abs Eosinophils: 0.1 10*3/uL (ref 0.1–0.5)
Abs Lymphs: 0.9 10*3/uL (ref 0.8–3.1)
Abs Monos: 1.4 10*3/uL — ABNORMAL HIGH (ref 0.2–0.8)
Absolute Nucleated RBC: 0.1 10*3/uL (ref <0.1)
Basophils: 1 %
Eosinophils: 1 %
Hct: 30.3 % — ABNORMAL LOW (ref 34.0–45.0)
Hgb: 9.1 gm/dL — ABNORMAL LOW (ref 11.2–15.7)
Imm Gran %: 7 % — ABNORMAL HIGH (ref <1)
Imm Gran Abs: 1 10*3/uL — ABNORMAL HIGH (ref <0.1)
Lymphocytes: 6 %
MCH: 28.5 pg (ref 26.0–32.0)
MCHC: 30 g/dL — ABNORMAL LOW (ref 32.0–36.0)
MCV: 95 um3 (ref 79.0–95.0)
MPV: 8.9 fL — ABNORMAL LOW (ref 9.4–12.4)
Monocytes: 9 %
NRBC: 1 /100 WBC (ref <1)
Plt Count: 471 10*3/uL — ABNORMAL HIGH (ref 140–370)
RBC: 3.19 10*6/uL — ABNORMAL LOW (ref 3.90–5.20)
RDW: 21.6 % — ABNORMAL HIGH (ref 12.0–14.0)
Segs: 76 %
WBC: 14.4 10*3/uL — ABNORMAL HIGH (ref 4.0–10.0)

## 2016-10-06 LAB — GLUCOSE (POCT)
Glucose (POCT): 103 mg/dL — ABNORMAL HIGH (ref 70–99)
Glucose (POCT): 100 mg/dL — ABNORMAL HIGH (ref 70–99)
Glucose (POCT): 125 mg/dL — ABNORMAL HIGH (ref 70–99)
Glucose (POCT): 125 mg/dL — ABNORMAL HIGH (ref 70–99)

## 2016-10-06 LAB — COMPREHENSIVE METABOLIC PANEL, BLOOD
ALT (SGPT): 39 U/L — ABNORMAL HIGH (ref 0–33)
AST (SGOT): 89 U/L — ABNORMAL HIGH (ref 0–32)
Albumin: 2.5 g/dL — ABNORMAL LOW (ref 3.5–5.2)
Alkaline Phos: 788 U/L — ABNORMAL HIGH (ref 35–140)
Anion Gap: 18 mmol/L — ABNORMAL HIGH (ref 7–15)
BUN: 9 mg/dL (ref 6–20)
Bicarbonate: 21 mmol/L — ABNORMAL LOW (ref 22–29)
Bilirubin, Tot: 16.48 mg/dL — ABNORMAL HIGH (ref <1.2)
Calcium: 9.2 mg/dL (ref 8.5–10.6)
Chloride: 94 mmol/L — ABNORMAL LOW (ref 98–107)
Creatinine: 0.49 mg/dL — ABNORMAL LOW (ref 0.51–0.95)
GFR: >60 mL/min
Glucose: 105 mg/dL — ABNORMAL HIGH (ref 70–99)
Potassium: 3.6 mmol/L (ref 3.5–5.1)
Sodium: 133 mmol/L — ABNORMAL LOW (ref 136–145)
Total Protein: 7 g/dL (ref 6.0–8.0)

## 2016-10-06 LAB — MRSA SURVEILLANCE CULTURE

## 2016-10-06 MED ORDER — GADOBUTROL 1 MMOL/ML IV SOLN
8.00 mL | Freq: Once | INTRAVENOUS | Status: AC
Start: 2016-10-06 — End: 2016-10-06
  Administered 2016-10-06: 8 mL via INTRAVENOUS
  Filled 2016-10-06: qty 8

## 2016-10-06 MED ORDER — URSODIOL 300 MG OR CAPS
600.0000 mg | ORAL_CAPSULE | Freq: Two times a day (BID) | ORAL | Status: DC
Start: 2016-10-06 — End: 2016-10-08
  Administered 2016-10-06 – 2016-10-08 (×5): 600 mg via ORAL
  Filled 2016-10-06 (×5): qty 2

## 2016-10-06 MED ORDER — IOHEXOL 350 MG/ML IV SOLN
75.00 mL | Freq: Once | INTRAVENOUS | Status: AC
Start: 2016-10-06 — End: 2016-10-06
  Administered 2016-10-06: 75 mL via INTRAVENOUS
  Filled 2016-10-06: qty 75

## 2016-10-06 NOTE — Interdisciplinary (Signed)
Nutrition Initial Assessment    A: 51 yo F with HCC, DM2, HTN who presents who worsening abdominal pain and jaundice    Past Medical History:   Diagnosis Date    DM II (diabetes mellitus, type II), controlled (CMS-HCC) 07/23/2016    GERD (gastroesophageal reflux disease)     Hepatocellular carcinoma (CMS-HCC)     Hypertension      Diet Rx:  Carb Limited (4 Carbs)                            PO intake: pt and visitor (neice) reported pt tolerates po, she ate late yesterday, today she is still feeling full. PTA po was good at home, eating small frequent meals  Tray Items Taken for the past 168 hrs:   Number of Items Taken Number of Items on Tray Diet Tolerance   10/06/16 0800 3 3 Tolerates     No data found.    04/23 0600 - 04/24 0559  In: 300 [P.O.:300]  Out: 200 [Urine:200]  Anthropometrics   Height:  4' 11' (1.499 m)  Weight: 89.1 kg (196 lb 7 oz) filed at 10/04/2016   @ 200 % IBW (of 44.3 Kg/ 97.5 lbs)  Body mass index is 40 kg/(m^2).      Wt Readings from Last 30 Encounters:   10/05/16 89.8 kg (197 lb 14.4 oz)   09/14/16 86.7 kg (191 lb 1.6 oz)   08/20/16 87.5 kg (193 lb)   08/18/16 88 kg (193 lb 14.4 oz)   08/12/16 88.7 kg (195 lb 9.6 oz)   08/05/16 91.2 kg (201 lb 1.6 oz)   08/05/16 91.5 kg (201 lb 11.2 oz)   07/27/16 92.5 kg (204 lb)   07/27/16 91.8 kg (202 lb 7 oz)     GI: no N/V, Abd distended, tender, hypoactive BS, BM PTA  Edema: +2 bilateral ankles  Skin: no pressure ulcer per Head-to-Toe assessment  Nutrition-Focused Physical Exam (4/24)  no evidence of muscle/fat loss noted per visual assessment     Labs:   Recent Labs      10/04/16   2048  10/05/16   0621  10/06/16   0558   NA  133*  132*  133*   K  3.5  3.4*  3.6   CL  94*  93*  94*   BICARB  23  21*  21*   BUN  8  7  9    CREAT  0.51  0.44*  0.49*   GLU  129*  92  105*   [place]  9.3  9.3  9.2   MG  2.1  2.0   --    PHOS  2.9  3.1   --    ALK  815*  833*  788*   ALT  39*  42*  39*   AST  78*  90*  89*   TBILI  15.42*  16.91*  16.48*   DBILI  11.2*  14.6*    --    ALB  2.8*  2.5*  2.5*       No results found for: VITD25HYDROX, VD2, VD3, VDT    Meds:  reviewed   sodium chloride        enoxaparin  40 mg Daily    insulin lispro  1-10 Units 4x Daily WC    polyethylene glycol  17 g Daily    senna  2 tablet HS    sodium  chloride (PF)  3 mL Q8H      bisacodyl  10 mg Daily PRN    magnesium hydroxide  30 mL Nightly PRN    nalOXone  0.1 mg Q2 Min PRN    ondansetron  4 mg Q6H PRN     Estimated Nutritional Needs per Mifflin-St. Jeor (89 Kg): 1420  x 1   Calories: 1420  Kcal/day (16 Kcal/89Kg actual wt)  Protein: 66-89  g/day (1.5-2 g/44.3 Kg IBW)  Maintenance fluids: per MD     Nutrition Dx: altered nutrient requirement r/t medical condition AEB calculated nutrition needs    Goal: PT to receive >80 % of estimated needs with acceptable tolerance    Intervention/Recommendation   - Continue Carb Limited (4 carbs) diet  - Encourage small frequent intake. Advised pt on current diet order, pt said she knows about the diet.    - Continue to monitor glycemic control and adjust insulin for blood sugar goal of <180 mg/dL.  - Weights q 48 hours via standing scale if feasible    Coordination of Care: Relayed plans and recommendations to care team members.   M/E:   Disposition: pending clinical course.   Education: if/when appropriate.   RD/DTR will monitor intake/tolerance/adequacy, labs, wt. and follow up per department policy.     Elizabeth Palau, MS, RD.  Pager # [phone]

## 2016-10-06 NOTE — Consults (Addendum)
Vascular and Interventional Radiology Consultation Note    Date: October 06, 2016   Patient Name: Laurie [name]   Medical Record #: [id number]   DOB: February 18, 1966, Age: 51 year old  Sex: female    Requesting MD: Self, Referred    History of Present Illness:     Laurie [name] is a 51 year old female with large biopsy proven [place] causing left intrahepatic biliary ductal dilatation.  VIR consulted for percutaneous biliary drainage. GI deferring ERCP due to only intrahepatic ductal dilatation.    Patient Active Problem List   Diagnosis    Liver mass, right lobe    DM II (diabetes mellitus, type II), controlled (CMS-HCC)    Jaundice    Elevated AFP    Liver mass    Hepatocellular carcinoma (CMS-HCC)    Abdominal pain    Hyperbilirubinemia         ALLERGIES:No Known Allergies  Current Facility-Administered Medications   Medication    acetaminophen (TYLENOL) tablet 650 mg    Or    acetaminophen (TYLENOL) solution 650 mg    Or    acetaminophen (TYLENOL) suppository 650 mg    acetaminophen (TYLENOL) tablet 650 mg    aluminum-magnesium-simethicone (MAG-AL PLUS) 200-200-20 MG/5ML suspension 30 mL    bisacodyl (DULCOLAX) suppository 10 mg    dextrose 50 % solution 12.5 g    enoxaparin (LOVENOX) injection 40 mg    glucagon (GLUCAGON) injection 1 mg    glucose chewable tablet 16 g    glucose oral gel 1 Tube    HYDROmorphone (DILAUDID) tablet 2 mg    HYDROmorphone (DILAUDID) tablet 8 mg    hydrOXYzine HCL (ATARAX) tablet 25 mg    insulin lispro (HUMALOG) injection 1-10 Units    lidocaine (LIDODERM) 5 % patch 1 patch    lidocaine (LIDODERM) 5 % patch 1 patch    magnesium hydroxide (MILK OF MAGNESIA) suspension 30 mL    nalOXone (NARCAN) injection 0.1 mg    ondansetron (ZOFRAN) injection 4 mg    polyethylene glycol (MIRALAX) packet 17 g    senna (SENOKOT) tablet 17.2 mg    sodium chloride (PF) 0.9 % flush 3 mL    sodium chloride (PF) 0.9 % flush 3 mL    sodium chloride 0.9 % TKO infusion     traZODone (DESYREL) tablet 50 mg    ursodiol (ACTIGALL) capsule 600 mg       VITALS  BP 122/67 (BP Location: Right arm, BP Patient Position: Sitting)   Pulse 105   Temp 98.8 F (37.1 C)   Resp 20   Wt 89.8 kg (197 lb 14.4 oz)   SpO2 95%   BMI 39.97 kg/m2    Laboratory data:   Lab Results   Component Value Date    INR 1.3 10/05/2016    PTT 39 (H) 10/05/2016     Lab Results   Component Value Date    WBC 14.4 (H) 10/06/2016    HGB 9.1 (L) 10/06/2016    HCT 30.3 (L) 10/06/2016    PLT 471 (H) 10/06/2016    LYMPHS 6 10/06/2016     Lab Results   Component Value Date    NA 133 (L) 10/06/2016    K 3.6 10/06/2016    CL 94 (L) 10/06/2016    BICARB 21 (L) 10/06/2016    BUN 9 10/06/2016    CREAT 0.49 (L) 10/06/2016    GLU 105 (H) 10/06/2016    [place] 9.2  10/06/2016     Lab Results   Component Value Date    AST 89 (H) 10/06/2016    ALT 39 (H) 10/06/2016    ALK 788 (H) 10/06/2016    TBILI 16.48 (H) 10/06/2016    DBILI 14.6 (H) 10/05/2016    TP 7.0 10/06/2016    ALB 2.5 (L) 10/06/2016       Imaging:   Left intrahepatic ductal dilatation    Assessment and Plan:  In summary, this patient is a 51 year old female with biliary obstruction of her left intrahepatic ducts due to a large [place].  VIR consulted for percutaneous biliary drainage.  1. Plan for tomorrow with [place] and fluoro guidance. Consent will be obtained with MARTI  2. Patient will be NPO for the procedure with moderate sedation.    3. Please hold AM lovenox.  4. If bilirubin improves post drainage, could consider palliative TACE as an in-patient.

## 2016-10-06 NOTE — Consults (Signed)
Gastroenterology/Hepatology Progress Note   GI Fellow/Author of Note: Glade Stanford, DO  Attending: Vida Roller, [facility] Stay:   1 day - Admitted on: 10/04/2016      Reason for follow-up: assessment of HCC and cirrhosis, new onset jaundice    Interval Events/Subjective:   -MR liver performed, no MRCP    Medications:  Scheduled Meds   enoxaparin  40 mg Daily    insulin lispro  1-10 Units 4x Daily WC    lidocaine  1 patch Q24H    lidocaine  1 patch Q24H    polyethylene glycol  17 g Daily    senna  2 tablet HS    sodium chloride (PF)  3 mL Q8H     PRN Meds   acetaminophen  650 mg Q4H PRN    Or    acetaminophen  650 mg Q4H PRN    Or    acetaminophen  650 mg Q4H PRN    acetaminophen  650 mg Q6H PRN    aluminum-magnesium-simethicone  30 mL Q6H PRN    bisacodyl  10 mg Daily PRN    dextrose  12.5 g PRN    glucagon  1 mg Once PRN    glucose  4 tablet PRN    glucose  1 Tube PRN    HYDROmorphone  2 mg Q4H PRN    HYDROmorphone  8 mg Q4H PRN    hydrOXYzine HCL  25 mg Q2H PRN    magnesium hydroxide  30 mL Nightly PRN    nalOXone  0.1 mg Q2 Min PRN    ondansetron  4 mg Q6H PRN    sodium chloride (PF)  3 mL PRN    sodium chloride   Continuous PRN    traZODone  50 mg Nightly PRN     IV Meds   sodium chloride           Vitals signs:     Latest Entry  Range (last 24 hours)    Temperature: 98.8 F (37.1 C)  Temp  Avg: 98.6 F (37 C)  Min: 97.5 F (36.4 C)  Max: 99.2 F (37.3 C)    Blood pressure (BP): 122/67  BP  Min: 107/66  Max: 149/76    Heart Rate: 105  Pulse  Avg: 106  Min: 98  Max: 113    Respirations: 20  Resp  Avg: 18.5  Min: 18  Max: 20    SpO2: 95 %  SpO2  Avg: 96.2 %  Min: 95 %  Max: 98 %       No Data Recorded     Weight: 89.8 kg (197 lb 14.4 oz)  Percentage Weight Change (%): 0.74 %    Intake/Output       10/05/16 0600 - 10/06/16 0559 10/06/16 0600 - 10/07/16 0559      [id number] 4166-0630 Total 0600-1759 1601-0932 Total       Intake    P.O.  --  300 300  250  -- 250    Total  Intake -- 300 300 250 -- 250       Output    Urine  --  200 200  200  -- 200    Total Output -- 200 200 200 -- 200       Net I/O     -- 100 100 50 -- 50          Physical Exam:   Gen: cooperative, appears well, in NAD  HEENT:  Eyes - no scleral icterus, MMM, O/P clear  Lungs: ctab, no wheezes, rales or crackles  Heart: RRR, normal S1,S2, no murmurs  Abd: +normoactive BS, Soft, Non-distended, non-tender, no Hepatomegaly/splenomegaly  Ext: No edema  Skin: No spider angiomas, jaundice, or palmar erythema  Neuro: axo x3, No asterixis        Labs:       CBC  Recent Labs      10/04/16   2048  10/05/16   0621  10/06/16   0558   WBC  14.4*  11.8*  14.4*   HGB  9.2*  8.9*  9.1*   HCT  31.9*  30.6*  30.3*   PLT  457*  415*  471*   BAND  4   --    --    SEG  68  72  76   LYMPHS  11  10  6    MONOS  8  9  9         Chemistry  Recent Labs      10/04/16   2048  10/05/16   0621  10/06/16   0558   NA  133*  132*  133*   K  3.5  3.4*  3.6   CL  94*  93*  94*   BICARB  23  21*  21*   BUN  8  7  9    CREAT  0.51  0.44*  0.49*   GLU  129*  92  105*   [place]  9.3  9.3  9.2   MG  2.1  2.0   --    PHOS  2.9  3.1   --      Recent Labs      10/04/16   2048  10/05/16   0621  10/06/16   0558   ALK  815*  833*  788*   AST  78*  90*  89*   ALT  39*  42*  39*   TBILI  15.42*  16.91*  16.48*   DBILI  11.2*  14.6*   --    ALB  2.8*  2.5*  2.5*          Coags  Recent Labs      10/04/16   2048  10/05/16   0954   PT  14.1*  13.9*   PTT  36*  39*   INR  1.3  1.3         Radiology:   MR a/p 04/24  IMPRESSION:  1. Enlarging biopsy-proven large hepatocellular carcinoma resulting in mass effect upon intrahepatic and extrahepatic portal veins. Multiple small lesions in the right lobe, segments 2, 3 and 4 are concerning for additional malignancies.    2. New anasarca of the abdominal wall. No large ascites.    Abdo U/S (10/04/2016): LIVER:  21 cm in long axis. Heterogeneous mass involving the right and left liver lobes measuring approximately 15.9 x 15.4 x 13.3 cm,  consistent with known hepatocellular carcinoma.  VESSELS:  Main portal vein diameter: 71m in the porta hepatis. Main portal vein flow: Patent with proper direction.  Right portal vein flow: Patent with proper direction. Left portal vein flow: Patent with properdirection. Hepatic veins: Patent  BILIARY: No intra or extra hepatic bile duct dilation.  OTHER: No ascites.  Spleen: Measures 10.7 cm.    CT Abdo/pelvis (09/14/2016):LUNG BASES: Please see today's chest CT.  LIVER/BILIARY:There is a large mass encompassing nearly the entirety of the right lobe  of the liver including segment 4 as well. It measures at least 17.4 by 13.4 by 16.3 cm. The mass displaces the left portal vein and significantly attenuates the right portal vein but they do appear patent. Hepatic veins are significantly compressed by this mass as well. Status post cholecystectomy. Mild inflammation and trace ascites adjacent to the lateral liver.  PANCREAS: Unremarkable  SPLEEN: Unremarkable.  ADRENAL GLANDS: Unremarkable  KIDNEYS: Right lower pole cyst.  STOMACH/DUODENUM: Unremarkable  VASCULATURE: Minimal atherosclerotic calcifications.  LYMPHATIC: Multiple perihepatic and peripancreatic lymph nodes largest of which measures 1 cm in short axis.  SMALL & LARGE BOWEL: Unremarkable  BLADDER/PELVIC ORGANS: Calcified mesenteric lymph node in the right hemipelvis.  BONES/SOFT TISSUES: Fat containing umbilical and ventral wall hernias.  OTHER: None     CT chest (09/14/2016):  Cardiovascular: Unremarkable.  Pericardium: No significant effusion.  Lymph nodes: No enlarged intrathoracic nodes identified.  Airways: Relatively unremarkable.  Pleura: No effusion or pneumothorax.  Lungs: No consolidation. New nodular opacities in the lungs which measure up to 6 mm. Stable appearance of calcified granuloma in the left lower lobe.  Imaged upper abdomen: Elevated right hemidiaphragm and large liver mass. Please see concurrently performed CT abdomen/pelvis.  Regional  skeleton: No acute findings.  Chest wall: No acute osseous abnormalities.      Prior Endoscopy:   EGD (08/20/2016): No Varices seen Z-line regular at approx. 38 cm from central incisors. Stomach with mild, diffuse erythema and altered texture. Biopsies obtained in the gastric antrum and body to r/o H.pylori. Normal retroflexion in the cardia and fundus Normal examined portion of duodenum     Colonoscopy (08/20/2016): Mild internal and external hemorrhoids in the rectum, seen upon retroflexion Tortuous left colon Exam otherwise normal      Assessment and  Care plan:    1)HCC: NaMELD 24, CP B9                        -Given increase in MELD, patient is no longer a candidate for TABE (contraindicated when MELD is this high, as it can precipitate liver failure)                        -Given size of hepatic lesion and the presence of possible lung mets on April 2 imaging of chest, she is not a transplant candidate                        -Will discuss case with MedOnc and IR to assess for other treatment options. As patient is a Childs-Pugh B, she is a marginal candidate for systemic chemotherapy.                        -Recommend MRCP to assess biliary tree. If there is biliary compression, then can consider decompression with external biliary drainage via IR    -consult Interventional radiology for possible percutaneous biliary drain                        -Given no obvious extrahepatic biliary obstruction on U/S, there is limited, if any, role in an ERCP.                         -Trial of Ursodiol 51m/kg to see if this can bring down her Bilirubin    2) Goals of Care -  Highly likely that patient will not be a candidate for treatment given extent of [place]. Discussed with patient limited options and extent of tumor burden with extremely poor prognosis.                        -Once all imaging is back, recommend discussing with social work/case management regarding eligibility for care    -recommend palliative care  consult                   See the fellow note for further details.    This patient was seen and discussed with Dr. Vida Roller      Electronically Signed by:  Glade Stanford, DO  Gastroenterology/Hepatology Fellow  P: [phone]

## 2016-10-06 NOTE — Plan of Care (Signed)
Problem: Nutrition Deficit  Goal: Adequate nutritional intake  Outcome: Goal Met  Hx DM.  BS stable.  Fair PO intake.  Cont to monitor and encourage PO intake.    Problem: Pain - Acute  Goal: Communication of presence of pain  Outcome: Goal Met    Goal: Control of acute pain  Outcome: Progressing toward goal, anticipate improvement over: Next 24-48 hours  Pt c/o intermittent pain on right abdomen and back.  Pain scale/regimen discussed, v/u.  Dilaudid 19m PO given prn with adequate relief.  Cont per IPOC.

## 2016-10-06 NOTE — Progress Notes (Addendum)
DAILY PROGRESS NOTE     10/06/16     Current Hospital Stay:   1 day - Admitted on: 10/04/2016    Subjective:  No BM in last 2 days    Objective:      Current Medications:   enoxaparin  40 mg Daily    insulin lispro  1-10 Units 4x Daily WC    lidocaine  1 patch Q24H    lidocaine  1 patch Q24H    polyethylene glycol  17 g Daily    senna  2 tablet HS    sodium chloride (PF)  3 mL Q8H      sodium chloride        acetaminophen  650 mg Q4H PRN    Or    acetaminophen  650 mg Q4H PRN    Or    acetaminophen  650 mg Q4H PRN    acetaminophen  650 mg Q6H PRN    aluminum-magnesium-simethicone  30 mL Q6H PRN    bisacodyl  10 mg Daily PRN    dextrose  12.5 g PRN    glucagon  1 mg Once PRN    glucose  4 tablet PRN    glucose  1 Tube PRN    HYDROmorphone  2 mg Q4H PRN    HYDROmorphone  8 mg Q4H PRN    hydrOXYzine HCL  25 mg Q2H PRN    magnesium hydroxide  30 mL Nightly PRN    nalOXone  0.1 mg Q2 Min PRN    ondansetron  4 mg Q6H PRN    sodium chloride (PF)  3 mL PRN    sodium chloride   Continuous PRN    traZODone  50 mg Nightly PRN       Review of Systems:   Nutrition: Diet Custom: Carb Limited (4 Carbs)   Gastrointestinal: constipation  Mobility:  Ad lib  Pain: @PAINSCORE @ 3/10    Vital Signs:  Temperature:  [97.5 F (36.4 C)-99.2 F (37.3 C)] 98.8 F (37.1 C) (04/24 1618)  Blood pressure (BP): (107-149)/(60-80) 122/67 (04/24 1618)  Heart Rate:  [98-113] 105 (04/24 1618)  Respirations:  [18-20] 20 (04/24 1618)  Pain Score: 8 (04/24 1621)  O2 Device: None (Room air) (04/24 1618)  SpO2:  [95 %-98 %] 95 % (04/24 1618)  RASS Score: Alert and calm    Wt Readings from Last 1 Encounters:   10/05/16 89.8 kg (197 lb 14.4 oz)       Intake/Output (Current Shift):  04/23 0600 - 04/24 0559  In: 300 [P.O.:300]  Out: 200 [Urine:200]    Physical Exam:  General:  NAD  HEENT:  NC AT  Neck:  No JVD trachea midline  Lungs:  CTA b/l  CV:  RRR S1S2  Abdomen:  S diffuse tendermess, distended BS + b/l  GU:  No CVA  tenderness  Extremities:  no c/c, trace edema is present  Neuro:  Dysthymia, cn 2-12 intact, motor and sensory nl    Diagnostic Data:  Laboratory data:   Lab Results   Component Value Date    K 3.6 10/06/2016    CL 94 (L) 10/06/2016    BICARB 21 (L) 10/06/2016    BUN 9 10/06/2016    CREAT 0.49 (L) 10/06/2016    GLU 105 (H) 10/06/2016    [place] 9.2 10/06/2016     Lab Results   Component Value Date    WBC 14.4 (H) 10/06/2016    HGB 9.1 (L) 10/06/2016  HCT 30.3 (L) 10/06/2016    PLT 471 (H) 10/06/2016    SEG 76 10/06/2016    BAND 4 10/04/2016    LYMPHS 6 10/06/2016    MONOS 9 10/06/2016    EOS 1 10/06/2016    NRBC 1 10/06/2016     Lab Results   Component Value Date    AST 89 (H) 10/06/2016    ALT 39 (H) 10/06/2016    ALK 788 (H) 10/06/2016    TBILI 16.48 (H) 10/06/2016    DBILI 14.6 (H) 10/05/2016    TP 7.0 10/06/2016    ALB 2.5 (L) 10/06/2016     Lab Results   Component Value Date    INR 1.3 10/05/2016    PTT 39 (H) 10/05/2016     POC Glucose Results: Recent Labs      10/05/16   1806  10/05/16   2114  10/06/16   0758  10/06/16   1314   GLUCPOCT  102*  108*  103*  100*       10/06/16: MRI ABD/MRCP: Enlarging biopsy-proven large hepatocellular carcinoma resulting in mass effect upon intrahepatic and extrahepatic portal veins. Multiple small lesions in the right lobe, segments 2, 3 and 4 are concerning for additional malignancies.  New anasarca of the abdominal wall    09/14/16 CT Chest: Multiple new intraparenchymal ground-glass nodular opacities in the right left lung, measuring up to 6 mm. These are concerning for metastases, especially given short interval growth.      Assessment and Plan:  This is a 51 yo F with HCC, DM2, HTN who presents who worsening abdominal pain and jaundice.    # Metastatic HCC/Hyperbilirubinemia   -Patient seen by oncology as an outpatient but unfortunately given social situation does not qualify for emergency coverage.  -MRCP above results  -Appreciate Hepatology input , start ursodial  -  consulted palliative care  - check CT chest to evaluate for mets  -It is likely that the patient will not be a candidate for any further intervention.  -Will need to coordinate goals of care discussion with Hepatology: Will discuss case with MedOnc and IR to assess for other treatment options. As patient is a Childs-Pugh B, she is a marginal candidate for systemic chemotherapy.  IR Percutaneous drain placement tomorrow, NPO PM      # Cancer Related Pain  -Lidocaine patch to right back/flank  -Dilaudid 2 mg po q4h prn moderate  -Dilaudid 4 mg po q4h prn severe     # Pruritis  -Hydroxyzine 25 mg po q2h prn   -ursodiol started    # Constipation  -Miralax 17 g po daily  -Senna 17.2 mg po qhs   -Bisacodyl supp daily prn    # DM2  -Carb limited diet  -Lispro sliding scale     # HTN  -Currently not on medications     # VTE Prophylaxis  -Lovenox 40 mg sq daily - on hold due to IR percutaneous drain placement      Code Status      Full Code - Call Code      Note Author: Maurine Minister, MD  10/06/16, 5:19 PM

## 2016-10-06 NOTE — Interdisciplinary (Signed)
Patient transported down to MRI via wheelchair at approximately [phone] by Biomedical scientist.

## 2016-10-07 ENCOUNTER — Inpatient Hospital Stay (HOSPITAL_BASED_OUTPATIENT_CLINIC_OR_DEPARTMENT_OTHER): Payer: Self-pay

## 2016-10-07 ENCOUNTER — Encounter (HOSPITAL_COMMUNITY)
Admission: EM | Disposition: A | Discharge status: Hospice | Payer: Self-pay | Source: Emergency Department | Attending: Hospitalist

## 2016-10-07 ENCOUNTER — Inpatient Hospital Stay (HOSPITAL_COMMUNITY): Payer: Self-pay

## 2016-10-07 DIAGNOSIS — G893 Neoplasm related pain (acute) (chronic): Secondary | ICD-10-CM

## 2016-10-07 DIAGNOSIS — Z515 Encounter for palliative care: Secondary | ICD-10-CM

## 2016-10-07 DIAGNOSIS — K831 Obstruction of bile duct: Secondary | ICD-10-CM

## 2016-10-07 DIAGNOSIS — K5903 Drug induced constipation: Secondary | ICD-10-CM

## 2016-10-07 DIAGNOSIS — T402X5A Adverse effect of other opioids, initial encounter: Secondary | ICD-10-CM

## 2016-10-07 DIAGNOSIS — C220 Liver cell carcinoma: Secondary | ICD-10-CM

## 2016-10-07 LAB — GLUCOSE (POCT)
Glucose (POCT): 89 mg/dL (ref 70–99)
Glucose (POCT): 74 mg/dL (ref 70–99)
Glucose (POCT): 73 mg/dL (ref 70–99)
Glucose (POCT): 81 mg/dL (ref 70–99)
Glucose (POCT): 81 mg/dL (ref 70–99)

## 2016-10-07 LAB — COMPREHENSIVE METABOLIC PANEL, BLOOD
ALT (SGPT): 42 U/L — ABNORMAL HIGH (ref 0–33)
AST (SGOT): 94 U/L — ABNORMAL HIGH (ref 0–32)
Albumin: 2.3 g/dL — ABNORMAL LOW (ref 3.5–5.2)
Alkaline Phos: 809 U/L — ABNORMAL HIGH (ref 35–140)
Anion Gap: 18 mmol/L — ABNORMAL HIGH (ref 7–15)
BUN: 12 mg/dL (ref 6–20)
Bicarbonate: 21 mmol/L — ABNORMAL LOW (ref 22–29)
Bilirubin, Tot: 17.2 mg/dL — ABNORMAL HIGH (ref <1.2)
Calcium: 9.2 mg/dL (ref 8.5–10.6)
Chloride: 93 mmol/L — ABNORMAL LOW (ref 98–107)
Creatinine: 0.51 mg/dL (ref 0.51–0.95)
GFR: >60 mL/min
Glucose: 95 mg/dL (ref 70–99)
Potassium: 3.6 mmol/L (ref 3.5–5.1)
Sodium: 132 mmol/L — ABNORMAL LOW (ref 136–145)
Total Protein: 7.1 g/dL (ref 6.0–8.0)

## 2016-10-07 LAB — CBC WITH DIFF, BLOOD
ANC-Automated: 9.7 10*3/uL — ABNORMAL HIGH (ref 1.6–7.0)
Abs Basophils: 0.1 10*3/uL (ref <0.1)
Abs Eosinophils: 0.1 10*3/uL (ref 0.1–0.5)
Abs Lymphs: 1 10*3/uL (ref 0.8–3.1)
Abs Monos: 1.2 10*3/uL — ABNORMAL HIGH (ref 0.2–0.8)
Absolute Nucleated RBC: 0.1 10*3/uL (ref <0.1)
Basophils: 1 %
Eosinophils: 1 %
Hct: 30.9 % — ABNORMAL LOW (ref 34.0–45.0)
Hgb: 8.9 gm/dL — ABNORMAL LOW (ref 11.2–15.7)
Imm Gran %: 7 % — ABNORMAL HIGH (ref <1)
Imm Gran Abs: 1 10*3/uL — ABNORMAL HIGH (ref <0.1)
Lymphocytes: 8 %
MCH: 27.5 pg (ref 26.0–32.0)
MCHC: 28.8 g/dL — ABNORMAL LOW (ref 32.0–36.0)
MCV: 95.4 um3 — ABNORMAL HIGH (ref 79.0–95.0)
MPV: 9 fL — ABNORMAL LOW (ref 9.4–12.4)
Monocytes: 9 %
NRBC: 1 /100 WBC (ref <1)
Plt Count: 512 10*3/uL — ABNORMAL HIGH (ref 140–370)
RBC: 3.24 10*6/uL — ABNORMAL LOW (ref 3.90–5.20)
RDW: 21.2 % — ABNORMAL HIGH (ref 12.0–14.0)
Segs: 74 %
WBC: 13.1 10*3/uL — ABNORMAL HIGH (ref 4.0–10.0)

## 2016-10-07 SURGERY — IR INSERT BILIARY TUBE

## 2016-10-07 SURGERY — IR TUBE CHECK
Laterality: Left

## 2016-10-07 MED ORDER — FENTANYL CITRATE (PF) 100 MCG/2ML IJ SOLN
INTRAMUSCULAR | Status: DC | PRN
Start: 2016-10-07 — End: 2016-10-07
  Administered 2016-10-07 (×2): 50 ug via INTRAVENOUS

## 2016-10-07 MED ORDER — MIDAZOLAM HCL 2 MG/2ML IJ SOLN
INTRAMUSCULAR | Status: DC | PRN
Start: 2016-10-07 — End: 2016-10-07
  Administered 2016-10-07 (×2): 1 mg via INTRAVENOUS

## 2016-10-07 MED ORDER — SODIUM CHLORIDE 0.9 % IV SOLN
1000.00 mg | Freq: Once | INTRAVENOUS | Status: AC
Start: 2016-10-07 — End: 2016-10-08
  Filled 2016-10-07: qty 1000

## 2016-10-07 MED ORDER — LIDOCAINE HCL 1 % IJ SOLN
INTRAMUSCULAR | Status: DC | PRN
Start: 2016-10-07 — End: 2016-10-07
  Administered 2016-10-07: 10 mL via INTRADERMAL

## 2016-10-07 MED ORDER — IODIXANOL 320 MG/ML IV SOLN
INTRAVENOUS | Status: DC | PRN
Start: 2016-10-07 — End: 2016-10-07
  Administered 2016-10-07: 20 mL

## 2016-10-07 MED ORDER — DEXTROSE-NACL 5-0.9 % IV SOLN (CUSTOM)
INTRAVENOUS | Status: DC | PRN
Start: 2016-10-07 — End: 2016-10-07
  Administered 2016-10-07: 100 mL/h via INTRAVENOUS

## 2016-10-07 SURGICAL SUPPLY — 14 items
APPLICATOR CHLORAPREP 26ML, ~~LOC~~ (Misc Medical Supply) ×2 IMPLANT
BAG DRAINAGE NEPHROSTOMY 600ML (Misc Medical Supply) ×2
CATHETER DRAIN BILIARY 8.5FR X 40CM MAC-LOC (Lines/Drains) ×2
CATHETER IMPRESS KA2 5FR X 40CM (Procedural wires/sheaths/catheters/balloons/dilators) ×2
DILATOR AQ HYDROPHILIC 7FR X 20CM (Procedural wires/sheaths/catheters/balloons/dilators) ×2 IMPLANT
GUIDEWIRE AMPLATZ SUPER STIFF 0.035" X 145CM, 7CM TAPER, STRAIGHT (Procedural wires/sheaths/catheters/balloons/dilators) ×2 IMPLANT
GUIDEWIRE GLIDEWIRE ANGLED .035" X 150CM (Procedural wires/sheaths/catheters/balloons/dilators) ×2
GUIDEWIRE STARTER J-CURVED FIXED CORE 0.035" X 150CM, 8CM TAPER, 15MM J TIP (Procedural wires/sheaths/catheters/balloons/dilators) ×2 IMPLANT
INTRODUCER SHEATH GREBSET 5FR X 30 CM W/70CM GUIDEWIRE (Procedural wires/sheaths/catheters/balloons/dilators) ×2 IMPLANT
INTRODUCER SHEATH PRELUDE 6FR X 11CM GREEN (Procedural wires/sheaths/catheters/balloons/dilators) ×2 IMPLANT
PROCEDURE PACK - IR NON-VASCULAR (Procedure Packs/kits) ×2 IMPLANT
SUTURE ETHILON 2-0 18" FS (Suture) ×2 IMPLANT
TORQUE DEVICE H20 ~~LOC~~ (Misc Surgical Supply) ×2 IMPLANT
TOWELS OR BLUE 4-PACK STERILE, DISPOSABLE (Drapes/towels) ×2 IMPLANT

## 2016-10-07 NOTE — Interdisciplinary (Signed)
10/07/16 1848   Provider Notification   Provider Notified Physician   Provider Name Dr. Posey Rea   Method of Communication Text Page   Reason for Communication [phone]- Family is requesting to speak to MD at this time. Please come to bedside. Thank you)

## 2016-10-07 NOTE — Plan of Care (Signed)
Problem: Nutrition Deficit  Goal: Adequate nutritional intake  Outcome: Unable to meet goal at this time.  Patient NPO today for IR biliary drain placement. Pts. blood sugars continued to be monitored.    Problem: Pain - Acute  Goal: Communication of presence of pain  Outcome: Progressing toward goal, anticipate improvement over: Next 24-48 hours  Patients pain in control with Lidoderm patches and Dilaudid 8mg  Q4hPRN

## 2016-10-07 NOTE — Progress Notes (Signed)
HISTORY & PHYSICAL - INTERVAL ASSESSMENT    **ONLY TO BE USED IN ADDITION TO A HISTORY & PHYSICAL**    Laurie [name]  [id number]      This interval assessment is required for History & Physical completed less than 30 days but more than 24 hours prior to the admission or surgery. A History & Physical completed more than 30 days prior to the admission or surgery must be repeated.    Current Medical Status:  Unchanged    Medications / Allergies:  Unchanged    Review of Systems:  Unchanged    Physical Examination:  I have examined the patient today. Pertinent portions of the exam are unchanged    Laboratory or Clinical Data:  Unchanged    Modifications of Initial Care Plan:  Unchanged    PRE-PROCEDURE RECORD FOR SEDATION    Planned procedure:  Biliary drainage    Planned type of sedation:   Moderate sedation.  I have discussed this procedure, its potential risks and complications, the risks associated with refusal of the procedure, and alternatives to the procedure with this patient.  Planned sedation route:  IV.    The patient has consented to the procedure.    Sedation/Anesthesia History:    The patient has no history of an adverse reaction to sedation or anesthesia.    I have assessed the patient's status immediately prior to this procedure:  Yes  I have discussed pain management needs and options for the patient with the patient or caregiver:  Yes    Sedation options, risks, and plans have been discussed with the patient or caregiver.  Questions were answered.  The patient or caregiver agrees to proceed as planned.    Airway History and Assessment:      Airway Class:  Class III.  Neck range of motion  normal.    Potential airway issues?  History of or risks for obstructive sleep apnea    ASA classification of physical status:  3.  A patient with severe systemic disease that limits activity but is not incapacitating.     --    Laurie [name]     10/07/16     3:40 PM

## 2016-10-07 NOTE — Consults (Signed)
Laurie [name]    Requesting Physician: Maurine Minister, MD  Reason for Consult: To evaluate patient for pain, constipation and goals of care.  Chief Complaint: "pain"    HPI: Laurie [name] is a 51 year old female with past medical history of HTN, DM with recently diagnosed [place] admitted on 10/04/2016 with 3 day history of increasing jaundice and abdominal pain.    Interval History:    - Seen with LCSW Amy de Meules, please see her note for more detail  - Patient and her sister   - Patient describes abd pain as below, also constipation, last BM 4/22.    - Denies SOB, insomnia  - endorse low appetite  - Understands that she has between 3 and 4 months to live  - Understands that there are no chemo options offered and that radiation may only give her a few additional weeks, she and sister worry about the burdens of radiation for so little additional time  - She says that quality time with her three daughters is most important to her now  - Worries most for her youngest daughter who is 65 and is disabled  - Is hoping that God grants her a miracle, but is accepting of whatever God's plan is for her  - Is interested in learning more about charity hospice while here in the hospital    Pain History:  Location: RUQ  Quality: sharp  Severity(1-10): 10  Duration: 3 weeks  Timing: constant with exacerabations  Modifying factors:       Aggravating factors: none identified       Alleviating factors: pain medication    ROS:   As in HPI and...  Constitutional: no weight loss  Eyes: no visual changes  ENT: no odynophagia  Cardiovascular: no chest pain, no palpitations  Respiratory: no cough  GI: no vomiting  GU: no dysuria, no incontinence  MS: no stiffness, no joint swelling  Neuro: no weakness  Psychiatric: no insomnia    Except as documented (above and in HPI), all other systems were reviewed and were negative.    Allergies: Review of patient's allergies indicates no known allergies.    Relevant  Medications:   insulin lispro  1-10 Units 4x Daily WC    lidocaine  1 patch Q24H    lidocaine  1 patch Q24H    polyethylene glycol  17 g Daily    senna  2 tablet HS    sodium chloride (PF)  3 mL Q8H    ursodiol  600 mg BID      acetaminophen  650 mg Q4H PRN      Or    acetaminophen  650 mg Q4H PRN      Or    acetaminophen  650 mg Q4H PRN      acetaminophen  650 mg Q6H PRN      aluminum-magnesium-simethicone  30 mL Q6H PRN      bisacodyl  10 mg Daily PRN      dextrose  12.5 g PRN      glucagon  1 mg Once PRN      glucose  4 tablet PRN      glucose  1 Tube PRN      HYDROmorphone  2 mg Q4H PRN      HYDROmorphone  8 mg Q4H PRN 8 mg at 10/07/16 0606    hydrOXYzine HCL  25 mg Q2H PRN 25 mg at 10/07/16 0823    magnesium  hydroxide  30 mL Nightly PRN      nalOXone  0.1 mg Q2 Min PRN      ondansetron  4 mg Q6H PRN      sodium chloride (PF)  3 mL PRN      sodium chloride   Continuous PRN      traZODone  50 mg Nightly PRN       Hydromorphone 8mg  PO x3    Morphine Equivalent Daily Dose = 120mg .    Past Medical History:     Past Medical History:   Diagnosis Date    DM II (diabetes mellitus, type II), controlled (CMS-HCC) 07/23/2016    GERD (gastroesophageal reflux disease)     Hepatocellular carcinoma (CMS-HCC)     Hypertension        Past Surgical History:     Past Surgical History:   Procedure Laterality Date    CESAREAN SECTION, LOW TRANSVERSE      times 3    CHOLECYSTECTOMY  2015       Social History:  Language: Spanish  Lives in [place] with: husband and children  Children: 3 daughters, 70, 42, 36 (disabled)  Prior employment: TEFL teacher  Spirituality or religion important? Catholic  Favorite activities: being with family    Family History:  Family History   Problem Relation Age of Onset    Hypertension Mother     Dementia Father     Diabetes Father     No Known Problems Daughter     No Known Problems Maternal Grandmother     No Known Problems Maternal Grandfather     No Known Problems  Paternal Grandmother     No Known Problems Paternal Grandfather     Diabetes Sister     No Known Problems Brother     No Known Problems Daughter     No Known Problems Daughter     No Known Problems Brother     No Known Problems Brother     Diabetes Sister     No Known Problems Sister     No Known Problems Sister        Examination:  Constitutional-  BP 118/68 (BP Location: Right arm, BP Patient Position: Supine)   Pulse 100   Temp 98 F (36.7 C)   Resp 16   Wt 89.8 kg (197 lb 14.4 oz)   SpO2 94%   BMI 39.97 kg/m2  Appearance: jaundiced  Level of alertness: fully alert    Eyes: no icterus; symmetric palpebral fissures  ENT: atraumatic; symmetric facies  Neck: trachea midline  Chest: respirations even and unlabored  Cardiovascular:reg rate to sl tachy  Abdomen:  + distended  MSK: no joint swelling   Neuro: AAOx4, nonfocal, no myoclonus  Skin: no visible rash; normal temperature      Pertinent Labs:  Lab Results   Component Value Date    WBC 13.1 (H) 10/07/2016     Lab Results   Component Value Date    HGB 8.9 (L) 10/07/2016      Lab Results   Component Value Date    PLT 512 (H) 10/07/2016     Lab Results   Component Value Date    CREAT 0.51 10/07/2016     Lab Results   Component Value Date    ALT 42 (H) 10/07/2016     Lab Results   Component Value Date    AST 94 (H) 10/07/2016     Lab Results   Component Value  Date    TBILI 17.20 (H) 10/07/2016     Lab Results   Component Value Date    ALB 2.3 (L) 10/07/2016       Yesterday's intake and output:  04/24 0600 - 04/25 0559  In: 920 [P.O.:920]  Out: 1250 [Urine:1250]    Above labs and results reviewed.    Advance Care Planning:  Review of old records in [place] and [place] indicates no advance directive or POLST.  Identified decision maker: not addressed today    Code Status      Full Code - Call Code: not addressed today    ASSESSMENT:    Laurie [name] is a 51 year old female with:    1. [place] without therapeutic options  2. Biliary  obstruction 2/2 #1  3. Jaundice 2/2 #2  4. Cancer related abdominal pain 2/2 #1 req PO opioids  5. Constipation 2/2 #4    Functional status: Palliative Performance Scale* 50%  Prognosis: weeks to months    RECOMMENDATIONS:    #GOC:  - to maximize time at home with family if there are no further therapeutic options  - symptom control  - Recommend consult SW for charity hospice evaluation    #PAIN: cancer-related, severe, req PO opioids; may be affected by IR biliary drainage today  - Currently well controlled with increased dosage of PO hydromorphone without side effects  - Continue hydromorphone 8mg  PO q2h PRN    #CONSTIPATION: likely opioid related  - Increase Senna to 17.2mg  PO BID  - Continue Miralax 17g PO BID    Thank you very much for involving [place] in [place] care.  Please do not hesitate to contact us with any further questions.   --    Alfonso Ellis, MD  [facility] Palliative Medicine Service, Team 2  [email or web address]  Personal pager: [phone] Service Team 2 pager: [phone]

## 2016-10-07 NOTE — Interdisciplinary (Signed)
10/07/16 1408   Assessment   Assessment Type Initial   Referral Information   Referral Type Hospice   Social Assessment   Primary Contact Name [place] (daughter)   Primary Contact Number [phone]   Primary Contact Relationship Child   Permission to Contact Yes   Secondary Contact Name [place] (daughter)   Contact Number [phone]   Secondary Contact Relationship Child       Social Work Note 10/07/16:    Laurie [name] is a "51 year old female with history of hypertension, diabetes and newly diagnosed hepatocellular carcinoma is presenting with a 3 day history with increasing abdominal pain and jaundice." SW received a message from the Director of CM that an administrator reached out to her yesterday regarding this pt and pt is hospice appropriate, however has no funding. SW paged MD Zayets to get a hospice SW consult. SW spoke with [place] and she and her team met with pt and family and they are aware of hospice and would like more info about it. Pt and family are Spanish speaking only, so SW paged the [place] interpreter for [facility] to assist with translation. SW received a call back from [place] interpreter and she will meet SW in the pt's room at 3:00 pm.     SW met with pt and her sister [name] with the assistance of Spanish interpreter [name]. SW introduced herself and her role and explained the purpose of the visit. SW provided supportive counseling and utilized active listening. SW empathized with pt and comforted her. Per pt, she lives with her husband and her 3 children. Her husband works the overnight shift at a [employer]. Her daughters can be with her in the evening. Pt expressed to SW that she has a strong and large support system. SW discussed what hospice is and what they offer. SW discussed how there are agencies that often do charity hospice for individuals who are unfunded. Pt understood and told SW that she and her daughter  can meet with an agency tomorrow morning. SW told pt that she will send a referral to 3 agencies and whoever responds first will meet with pt and her family. SW informed pt that if she doesn't like the agency, SW can arrange other meetings as well. Pt understood and was very thankful.     SW sent [place] referrals to [facility], [facility], and [facility]. SW asked for a meeting with pt/family tomorrow morning. SW spoke with Hassan Rowan from [facility] and Ms. Curi will come out and meet with pt/family tomorrow between 8:30 and 10:30 am. SW updated CM and pt/family. No other SW needs. SW to continue to follow.       [name], MSW, [place] Social Worker I  [phone]

## 2016-10-07 NOTE — Progress Notes (Signed)
[place]  Clinical Social Work Assessment    Patient summary   Pt is a "51 yo F with [name], DM2, HTN who presents who worsening abdominal pain and jaundice." Palliative care being consulted for goals of care.    Met with pt and 2 sisters. Altamease Oiler provided in person interpretation. Please see note from Dr. Alyse Low for more information.    Psycho-Social-Spiritual History  Pt is married and has 3 daughters (29, 74, and 83). Pt's 2 older Dtrs are married. Pt shares that her youngest Dtr has a disability.    Understanding of Illness  Pt and family express understanding that pt has [place] and that her prognosis is about 3 months without treatment. They understand that treatments carry additional burden and are likely to have limited impact to lengthen pt's prognosis.    Sources of Joy/Quality Of Life  Pt enjoys time time with her family.    Strengths and Coping  Pt's family has been very supportive of pt and are present in the hospital with pt. Her sisters have been helping her remember details of her care since she feels overwhelmed at times. She has a strong faith in God, [name]. She expresses gratitude for the time that she has to say goodbye to her family.    Hopes and Worries  Pt is hopeful that God will be a miracle from God to cure her cancer. Pt is most worried about her children.    Assessment/recommendations:   Pt and family are acutely grieving her [name] terminal [name] diagnosis. Pt identifies that spending time with her children is most important to her. Pt and family are receptive to exploring hospice services.  (1) Discussed concept and services of home hospice. Pt and family would benefit from meeting with unit social worker to discus referral for charity hospice.  (2) [place] [facility] to continue to follow as needed.     Thank you so much for involving [place] in the care of Laurie [name]. Please do not hesitate to contact us for further support.  [name] de Harriett Rush, [place]  Team 2, pager [phone]

## 2016-10-07 NOTE — Interdisciplinary (Signed)
I spoke with Roselyn Reef the SW about this patient being eval for hospice. CM and SW will work together to meet this patients DC needs.

## 2016-10-07 NOTE — Plan of Care (Signed)
Problem: Nutrition Deficit  Goal: Adequate nutritional intake  Outcome: Goal Met      Problem: Pain - Acute  Goal: Communication of presence of pain  Outcome: Goal Met    Goal: Control of acute pain  Outcome: Goal Met

## 2016-10-07 NOTE — Interdisciplinary (Signed)
10/07/16 1726   Provider Notification   Provider Notified Physician   Provider Name Dr. Posey Rea   Method of Communication Text Page   Reason for Communication ([place] back from IR. Can patient eat and drink? Please place orders)

## 2016-10-07 NOTE — Consults (Signed)
Gastroenterology/Hepatology Progress Note   GI Fellow/Author of Note: Glade Stanford, DO  Attending: Vida Roller, [place] [facility] Stay:   2 days - Admitted on: 10/04/2016      Reason for follow-up: assessment of HCC and cirrhosis, new onset jaundice    Interval Events/Subjective:   -social work eval for charity hospice  -ursodiol started  -IR to place perc biliary drain today    Medications:  Scheduled Meds   insulin lispro  1-10 Units 4x Daily WC    lidocaine  1 patch Q24H    lidocaine  1 patch Q24H    polyethylene glycol  17 g Daily    senna  2 tablet HS    sodium chloride (PF)  3 mL Q8H    ursodiol  600 mg BID     PRN Meds   acetaminophen  650 mg Q4H PRN    Or    acetaminophen  650 mg Q4H PRN    Or    acetaminophen  650 mg Q4H PRN    acetaminophen  650 mg Q6H PRN    aluminum-magnesium-simethicone  30 mL Q6H PRN    bisacodyl  10 mg Daily PRN    dextrose  12.5 g PRN    glucagon  1 mg Once PRN    glucose  4 tablet PRN    glucose  1 Tube PRN    HYDROmorphone  2 mg Q4H PRN    HYDROmorphone  8 mg Q4H PRN    hydrOXYzine HCL  25 mg Q2H PRN    magnesium hydroxide  30 mL Nightly PRN    nalOXone  0.1 mg Q2 Min PRN    ondansetron  4 mg Q6H PRN    sodium chloride (PF)  3 mL PRN    sodium chloride   Continuous PRN    traZODone  50 mg Nightly PRN     IV Meds   sodium chloride           Vitals signs:     Latest Entry  Range (last 24 hours)    Temperature: 98 F (36.7 C)  Temp  Avg: 98.2 F (36.8 C)  Min: 98 F (36.7 C)  Max: 98.8 F (37.1 C)    Blood pressure (BP): 118/68  BP  Min: 116/70  Max: 128/68    Heart Rate: 100  Pulse  Avg: 101.8  Min: 98  Max: 105    Respirations: 16  Resp  Avg: 18  Min: 16  Max: 20    SpO2: 94 %  SpO2  Avg: 95.2 %  Min: 94 %  Max: 96 %       No Data Recorded     Weight: 89.8 kg (197 lb 14.4 oz)  Percentage Weight Change (%): 0.74 %    Intake/Output       10/06/16 0600 - 10/07/16 0559 10/07/16 0600 - 10/08/16 0559      [id number] 5465-0354 Total 0600-1759 6568-1275  Total       Intake    P.O.  500  420 920  --  -- --    Total Intake 500 420 920 -- -- --       Output    Urine  200  1050 1250  --  -- --    Total Output 200 1050 1250 -- -- --       Net I/O     300 -630 -330 -- -- --  Physical Exam:   Gen: cooperative, appears well, in NAD, extremely jaundiced  HEENT: Eyes - scleral icterus, MMM, O/P clear  Lungs: ctab, no wheezes, rales or crackles  Heart: RRR, normal S1,S2, no murmurs  Abd: +normoactive BS, Soft, distended, non-tender,  Ext: No edema  Skin: No spider angiomas or palmar erythema  Neuro: axo x3, No asterixis        Labs:       CBC  Recent Labs      10/04/16   2048   10/06/16   0558  10/07/16   0545   WBC  14.4*   < >  14.4*  13.1*   HGB  9.2*   < >  9.1*  8.9*   HCT  31.9*   < >  30.3*  30.9*   PLT  457*   < >  471*  512*   BAND  4   --    --    --    SEG  68   < >  76  74   LYMPHS  11   < >  6  8   MONOS  8   < >  9  9    < > = values in this interval not displayed.        Chemistry  Recent Labs      10/04/16   2048  10/05/16   0621  10/06/16   0558  10/07/16   0545   NA  133*  132*  133*  132*   K  3.5  3.4*  3.6  3.6   CL  94*  93*  94*  93*   BICARB  23  21*  21*  21*   BUN  _0 CREAT  0.51  0.44*  0.49*  0.51   GLU  129*  92  105*  95   [place]  9.3  9.3  9.2  9.2   MG  2.1  2.0   --    --    PHOS  2.9  3.1   --    --      Recent Labs      10/04/16   2048  10/05/16   0621  10/06/16   0558  10/07/16   0545   ALK  815*  833*  788*  809*   AST  78*  90*  89*  94*   ALT  39*  42*  39*  42*   TBILI  15.42*  16.91*  16.48*  17.20*   DBILI  11.2*  14.6*   --    --    ALB  2.8*  2.5*  2.5*  2.3*          Coags  Recent Labs      10/04/16   2048  10/05/16   0954   PT  14.1*  13.9*   PTT  36*  39*   INR  1.3  1.3         Radiology:   MR a/p 04/24  IMPRESSION:  1. Enlarging biopsy-proven large hepatocellular carcinoma resulting in mass effect upon intrahepatic and extrahepatic portal veins. Multiple small lesions in the right lobe, segments 2, 3 and 4 are  concerning for additional malignancies.    2. New anasarca of the abdominal wall. No large ascites.    Abdo U/S (10/04/2016): LIVER:  21 cm in long axis. Heterogeneous mass involving  the right and left liver lobes measuring approximately 15.9 x 15.4 x 13.3 cm, consistent with known hepatocellular carcinoma.  VESSELS:  Main portal vein diameter: 37m in the porta hepatis. Main portal vein flow: Patent with proper direction.  Right portal vein flow: Patent with proper direction. Left portal vein flow: Patent with properdirection. Hepatic veins: Patent  BILIARY: No intra or extra hepatic bile duct dilation.  OTHER: No ascites.  Spleen: Measures 10.7 cm.    CT Abdo/pelvis (09/14/2016):LUNG BASES: Please see today's chest CT.  LIVER/BILIARY:There is a large mass encompassing nearly the entirety of the right lobe of the liver including segment 4 as well. It measures at least 17.4 by 13.4 by 16.3 cm. The mass displaces the left portal vein and significantly attenuates the right portal vein but they do appear patent. Hepatic veins are significantly compressed by this mass as well. Status post cholecystectomy. Mild inflammation and trace ascites adjacent to the lateral liver.  PANCREAS: Unremarkable  SPLEEN: Unremarkable.  ADRENAL GLANDS: Unremarkable  KIDNEYS: Right lower pole cyst.  STOMACH/DUODENUM: Unremarkable  VASCULATURE: Minimal atherosclerotic calcifications.  LYMPHATIC: Multiple perihepatic and peripancreatic lymph nodes largest of which measures 1 cm in short axis.  SMALL & LARGE BOWEL: Unremarkable  BLADDER/PELVIC ORGANS: Calcified mesenteric lymph node in the right hemipelvis.  BONES/SOFT TISSUES: Fat containing umbilical and ventral wall hernias.  OTHER: None     CT chest (09/14/2016):  Cardiovascular: Unremarkable.  Pericardium: No significant effusion.  Lymph nodes: No enlarged intrathoracic nodes identified.  Airways: Relatively unremarkable.  Pleura: No effusion or pneumothorax.  Lungs: No consolidation. New  nodular opacities in the lungs which measure up to 6 mm. Stable appearance of calcified granuloma in the left lower lobe.  Imaged upper abdomen: Elevated right hemidiaphragm and large liver mass. Please see concurrently performed CT abdomen/pelvis.  Regional skeleton: No acute findings.  Chest wall: No acute osseous abnormalities.      Prior Endoscopy:   EGD (08/20/2016): No Varices seen Z-line regular at approx. 38 cm from central incisors. Stomach with mild, diffuse erythema and altered texture. Biopsies obtained in the gastric antrum and body to r/o H.pylori. Normal retroflexion in the cardia and fundus Normal examined portion of duodenum     Colonoscopy (08/20/2016): Mild internal and external hemorrhoids in the rectum, seen upon retroflexion Tortuous left colon Exam otherwise normal      Assessment and  Care plan:    1)HCC: NaMELD 24, CP C                        -Given increase in TBILI, patient is no longer a candidate for locoregional therapy or systemic chemotherapy (contraindicated in CP C with high MELD as it can precipitate liver failure)                        -Not a candidate for transplant due to metastatic [place]disease                        -IR to place percutaneous biliary drain today to help reduce tbili for palliation. Hope would be to reduce bili to <5 to potentially offer systemic chemotherapy.                          -Given no obvious extrahepatic biliary obstruction on U/S, there is limited, if any, role in an ERCP.                        -  Ursodiol 5m/kg to see if this can bring down her Bilirubin    2) Goals of Care - Metastatic HCC disease. Large tumor burden. Discussed with patient limited options and extent of tumor burden with extremely poor prognosis.                         -palliative care consultation placed    -reaching out to social work to evaluate patient for charity hospice care                 This patient was seen and discussed with Dr. JVida Roller     Electronically  Signed by:  ZGlade Stanford DO  Gastroenterology/Hepatology Fellow  P: [phone]

## 2016-10-07 NOTE — Brief Op Note (Signed)
Preprocedure diagnosis:  [place] with biliary ductal dilation, elevated bilirubin and leukocytosis    Postprocedure diagnosis:  [place] with biliary ductal dilation, elevated bilirubin and leukocytosis    Procedure:  Left external biliary drain placement    Operators:  A Malanie Koloski, MD  C Farrel Conners    Medications:  Monitored conscious sedation    Findings:  Irregular obstructed left bile ducts  8.5 Fr drain placed across the left bile ducts and into the right-side system    Specimens:  Bile aspirate    Complications:  None    Disposition:  Stable to ward  Routine drain care  Plan for attempted drain internalization next week

## 2016-10-07 NOTE — Plan of Care (Signed)
“  Report given to Lauralyn Primes." RN informed patient can receive pain medication whenever requested by patient

## 2016-10-07 NOTE — Progress Notes (Signed)
DAILY PROGRESS NOTE     10/07/16     Current Hospital Stay:   2 days - Admitted on: 10/04/2016    Subjective:  No BM in last few days    Objective:      Current Medications:   cefTRIAXone (ROCEPHIN) IV  1,000 mg Once    insulin lispro  1-10 Units 4x Daily WC    lidocaine  1 patch Q24H    lidocaine  1 patch Q24H    polyethylene glycol  17 g Daily    senna  2 tablet HS    sodium chloride (PF)  3 mL Q8H    ursodiol  600 mg BID      dextrose-sodium chloride 5%-0.9% 100 mL/hr (10/07/16 1612)    sodium chloride        acetaminophen  650 mg Q4H PRN    Or    acetaminophen  650 mg Q4H PRN    Or    acetaminophen  650 mg Q4H PRN    acetaminophen  650 mg Q6H PRN    aluminum-magnesium-simethicone  30 mL Q6H PRN    bisacodyl  10 mg Daily PRN    dextrose  12.5 g PRN    dextrose-sodium chloride 5%-0.9%   Intra-Op Continuous    fentaNYL   Intra-Op PRN    glucagon  1 mg Once PRN    glucose  4 tablet PRN    glucose  1 Tube PRN    HYDROmorphone  2 mg Q4H PRN    HYDROmorphone  8 mg Q4H PRN    hydrOXYzine HCL  25 mg Q2H PRN    lidocaine   Intra-Op PRN    magnesium hydroxide  30 mL Nightly PRN    midazolam   Intra-Op PRN    nalOXone  0.1 mg Q2 Min PRN    ondansetron  4 mg Q6H PRN    sodium chloride (PF)  3 mL PRN    sodium chloride   Continuous PRN    traZODone  50 mg Nightly PRN       Review of Systems:   Nutrition: Diet NPO Are oral medications allowed while the patient is NPO? No; Reason for NPO status: Patient NPO for procedure; Please specify procedure: ir percutaneous biliary drain on 10/07/16   Gastrointestinal: constipation  Mobility:  Ad lib  Pain: @PAINSCORE @ 3/10    Vital Signs:  Temperature:  [98 F (36.7 C)-98.4 F (36.9 C)] 98.4 F (36.9 C) (04/25 1359)  Blood pressure (BP): (116-128)/(59-71) 124/71 (04/25 1610)  Heart Rate:  [98-107] 107 (04/25 1610)  Respirations:  [16-18] 17 (04/25 1610)  Pain Score: 3 (04/25 1450)  O2 Device: None (Room air) (04/25 1359)  SpO2:  [94 %-99 %] 99 % (04/25  1610)  RASS Score: Alert and calm    Wt Readings from Last 1 Encounters:   10/05/16 89.8 kg (197 lb 14.4 oz)       Intake/Output (Current Shift):  04/24 0600 - 04/25 0559  In: 920 [P.O.:920]  Out: 1250 [Urine:1250]    Physical Exam:  General:  NAD, jaundice  Eyes: icterus  HEENT:  NC AT  Neck:  No JVD trachea midline  Lungs:  CTA b/l  CV:  RRR S1S2  Abdomen:  S diffuse tendermess, distended BS + b/l  GU:  No CVA tenderness  Extremities:  no c/c, trace edema is present  Neuro:  Dysthymia, cn 2-12 intact, motor and sensory nl    Diagnostic Data:  Laboratory data:  Lab Results   Component Value Date    K 3.6 10/07/2016    CL 93 (L) 10/07/2016    BICARB 21 (L) 10/07/2016    BUN 12 10/07/2016    CREAT 0.51 10/07/2016    GLU 95 10/07/2016    [place] 9.2 10/07/2016     Lab Results   Component Value Date    WBC 13.1 (H) 10/07/2016    HGB 8.9 (L) 10/07/2016    HCT 30.9 (L) 10/07/2016    PLT 512 (H) 10/07/2016    SEG 74 10/07/2016    LYMPHS 8 10/07/2016    MONOS 9 10/07/2016    EOS 1 10/07/2016    NRBC 1 10/07/2016     Lab Results   Component Value Date    AST 94 (H) 10/07/2016    ALT 42 (H) 10/07/2016    ALK 809 (H) 10/07/2016    TBILI 17.20 (H) 10/07/2016    TP 7.1 10/07/2016    ALB 2.3 (L) 10/07/2016     No results found for: INR, PTT  POC Glucose Results:   Recent Labs      10/06/16   2100  10/07/16   0921  10/07/16   1402  10/07/16   1515   GLUCPOCT  125*  89  74  73       10/06/16: MRI ABD/MRCP: Enlarging biopsy-proven large hepatocellular carcinoma resulting in mass effect upon intrahepatic and extrahepatic portal veins. Multiple small lesions in the right lobe, segments 2, 3 and 4 are concerning for additional malignancies.  New anasarca of the abdominal wall    09/14/16 CT Chest: Multiple new intraparenchymal ground-glass nodular opacities in the right left lung, measuring up to 6 mm. These are concerning for metastases, especially given short interval growth.    10/07/16: 1. Increased size and number of pulmonary nodules  compatible with increasing metastatic tumor burden of the lungs.2. Unchanged subcentimeter intrathoracic lymph nodes, nonspecific..      Assessment and Plan:  This is a 51 yo F with HCC, DM2, HTN who presents who worsening abdominal pain and jaundice.    # Metastatic HCC/Hyperbilirubinemia   -Patient seen by oncology as an outpatient but unfortunately given social situation does not qualify for emergency coverage.  -MRCP above results  -Appreciate Hepatology input ,   - cont ursodial  - consulted palliative care, meeting today and introducing hospice  - CT chest to evaluate for mets (see above) worsening mets  -It is likely that the patient will not be a candidate for any further intervention.  -Will need to coordinate goals of care discussion with her family H  As patient is a Childs-Pugh B, she is a marginal candidate for systemic chemotherapy.  IR Percutaneous drain placement today, NPO PM      # Cancer Related Pain  -Lidocaine patch to right back/flank  -Dilaudid 2 mg po q4h prn moderate  -Dilaudid 4 mg po q4h prn severe     # Pruritis  -Hydroxyzine 25 mg po q2h prn   -ursodiol started    # Constipation  -Miralax 17 g po daily  -Senna 17.2 mg po qhs   -Bisacodyl supp daily prn    # DM2  -Carb limited diet  -Lispro sliding scale     # HTN  -Currently not on medications     # VTE Prophylaxis  -Lovenox 40 mg sq daily - on hold due to IR percutaneous drain placement today      Code Status  Full Code - Call Code - will readdress code status      Note Author: Maurine Minister, MD  10/06/16, 5:19 PM

## 2016-10-07 NOTE — Interdisciplinary (Signed)
10/07/16 1404   Provider Notification   Provider Notified Physician   Provider Name Dr. Posey Rea   Method of Communication Text Page   Reason for Communication ([place] blood sugar 74 and patient is NPO. Do you want to start IV fluids? RN concerned about hypoglycemic event.)

## 2016-10-07 NOTE — Interdisciplinary (Signed)
10/07/16 1758   Provider Notification   Provider Name Pharmacy   Method of Communication Text Page   Reason for Communication [phone] Simmonds- Please call RN)

## 2016-10-08 ENCOUNTER — Inpatient Hospital Stay (HOSPITAL_BASED_OUTPATIENT_CLINIC_OR_DEPARTMENT_OTHER): Payer: Self-pay

## 2016-10-08 LAB — CBC WITH DIFF, BLOOD
ANC-Automated: 11 10*3/uL — ABNORMAL HIGH (ref 1.6–7.0)
Abs Basophils: 0.1 10*3/uL (ref <0.1)
Abs Lymphs: 0.7 10*3/uL — ABNORMAL LOW (ref 0.8–3.1)
Abs Monos: 1 10*3/uL — ABNORMAL HIGH (ref 0.2–0.8)
Absolute Nucleated RBC: 0.1 10*3/uL (ref <0.1)
Hct: 28.3 % — ABNORMAL LOW (ref 34.0–45.0)
Hgb: 8.6 gm/dL — ABNORMAL LOW (ref 11.2–15.7)
Imm Gran %: 7 % — ABNORMAL HIGH (ref <1)
Imm Gran Abs: 1 10*3/uL — ABNORMAL HIGH (ref <0.1)
Lymphocytes: 5 %
MCH: 28.7 pg (ref 26.0–32.0)
MCHC: 30.4 g/dL — ABNORMAL LOW (ref 32.0–36.0)
MCV: 94.3 um3 (ref 79.0–95.0)
MPV: 9 fL — ABNORMAL LOW (ref 9.4–12.4)
Monocytes: 8 %
Plt Count: 431 10*3/uL — ABNORMAL HIGH (ref 140–370)
RBC: 3 10*6/uL — ABNORMAL LOW (ref 3.90–5.20)
RDW: 20.8 % — ABNORMAL HIGH (ref 12.0–14.0)
Segs: 80 %
WBC: 13.8 10*3/uL — ABNORMAL HIGH (ref 4.0–10.0)

## 2016-10-08 LAB — COMPREHENSIVE METABOLIC PANEL, BLOOD
ALT (SGPT): 39 U/L — ABNORMAL HIGH (ref 0–33)
AST (SGOT): 94 U/L — ABNORMAL HIGH (ref 0–32)
Albumin: 2.2 g/dL — ABNORMAL LOW (ref 3.5–5.2)
Alkaline Phos: 761 U/L — ABNORMAL HIGH (ref 35–140)
Anion Gap: 19 mmol/L — ABNORMAL HIGH (ref 7–15)
BUN: 14 mg/dL (ref 6–20)
Bicarbonate: 19 mmol/L — ABNORMAL LOW (ref 22–29)
Bilirubin, Tot: 16.48 mg/dL — ABNORMAL HIGH (ref <1.2)
Calcium: 9.3 mg/dL (ref 8.5–10.6)
Chloride: 93 mmol/L — ABNORMAL LOW (ref 98–107)
Creatinine: 0.53 mg/dL (ref 0.51–0.95)
GFR: >60 mL/min
Glucose: 81 mg/dL (ref 70–99)
Potassium: 3.9 mmol/L (ref 3.5–5.1)
Sodium: 131 mmol/L — ABNORMAL LOW (ref 136–145)
Total Protein: 6.9 g/dL (ref 6.0–8.0)

## 2016-10-08 LAB — GLUCOSE (POCT): Glucose (POCT): 83 mg/dL (ref 70–99)

## 2016-10-08 MED ORDER — HYDROXYZINE HCL 25 MG OR TABS
25.0000 mg | ORAL_TABLET | ORAL | 0 refills | Status: DC | PRN
Start: 2016-10-08 — End: 2016-10-08

## 2016-10-08 MED ORDER — LIDOCAINE 5 % EX PTCH
1.0000 | MEDICATED_PATCH | CUTANEOUS | 0 refills | Status: DC
Start: 2016-10-09 — End: 2016-10-08

## 2016-10-08 MED ORDER — BISACODYL 10 MG RE SUPP
10.00 mg | Freq: Every day | RECTAL | 0 refills | Status: AC | PRN
Start: 2016-10-08 — End: ?

## 2016-10-08 MED ORDER — LIDOCAINE 5 % EX PTCH
1.00 | MEDICATED_PATCH | CUTANEOUS | 0 refills | Status: AC
Start: 2016-10-09 — End: ?

## 2016-10-08 MED ORDER — BISACODYL 10 MG RE SUPP
10.0000 mg | Freq: Every day | RECTAL | 0 refills | Status: DC | PRN
Start: 2016-10-08 — End: 2016-10-08

## 2016-10-08 MED ORDER — URSODIOL 300 MG OR CAPS
600.00 mg | ORAL_CAPSULE | Freq: Two times a day (BID) | ORAL | 0 refills | Status: AC
Start: 2016-10-08 — End: ?

## 2016-10-08 MED ORDER — URSODIOL 300 MG OR CAPS
600.0000 mg | ORAL_CAPSULE | Freq: Two times a day (BID) | ORAL | 0 refills | Status: DC
Start: 2016-10-08 — End: 2016-10-08

## 2016-10-08 MED ORDER — HYDROXYZINE HCL 25 MG OR TABS
25.00 mg | ORAL_TABLET | ORAL | 0 refills | Status: AC | PRN
Start: 2016-10-08 — End: ?

## 2016-10-08 MED ORDER — HYDROMORPHONE HCL 2 MG OR TABS
2.00 mg | ORAL_TABLET | ORAL | 0 refills | Status: AC | PRN
Start: 2016-10-08 — End: ?

## 2016-10-08 NOTE — Progress Notes (Signed)
Vascular  Interventional Radiology Progress Note     Reason For Encounter: left Percutaneous Internal/External biliary drain placed 10/07/16 for biliary obstruction.     Subjective:No pain at drain    Physical Exam:  Temperature:  [97.5 F (36.4 C)-98.6 F (37 C)] 97.5 F (36.4 C) (04/26 0730)  Blood pressure (BP): (111-129)/(65-78) 117/76 (04/26 0730)  Heart Rate:  [92-109] 109 (04/26 0730)  Respirations:  [14-20] 17 (04/26 0730)  Pain Score: 5 (04/26 0948)  O2 Device: None (Room air) (04/26 0730)  O2 Flow Rate (L/min):  [2 l/min] 2 l/min (04/25 2145)  SpO2:  [94 %-100 %] 95 % (04/26 0730)  BP 117/76 (BP Location: Left arm, BP Patient Position: Sitting)   Pulse 109   Temp 97.5 F (36.4 C)   Resp 17   Wt 89.8 kg (197 lb 14.4 oz)   SpO2 95%   BMI 39.97 kg/m2  04/25 0600 - 04/26 0559  In: 420 [P.O.:360; I.V.:50]  Out: 800 [Urine:450; Drains:350]      ABDOMEN: soft, NT, distended            PBD: Output: 350 mls/24 hr           : clear and brown                 Dressing:  C/D/I      Labs:  CBC  Recent Labs      10/07/16   0545  10/08/16   0540   WBC  13.1*  13.8*   HGB  8.9*  8.6*   HCT  30.9*  28.3*   PLT  512*  431*       Lab Results   Component Value Date    AST 94 (H) 10/08/2016    ALT 39 (H) 10/08/2016    ALK 761 (H) 10/08/2016    TBILI 16.48 (H) 10/08/2016    TBILI 17.20 (H) 10/07/2016    TBILI 16.48 (H) 10/06/2016    DBILI 14.6 (H) 10/05/2016    TP 6.9 10/08/2016    ALB 2.2 (L) 10/08/2016       Micro  Rare wbc    ASSESSMENT AND PLAN:   left Percutaneous Internal/External biliary drain for biliary obstruction. Tubes Functional,  Continue Drainage and monitor T bili.     1. Flush drain with 57m sterile normal saline via stopcock [daily] [BID] .  2. Strict I/O  3. Change dressing q3days or PRN with chloroprep to dry gauze to Tegaderm    Please contact IR with any drain related inquiries.    Discussed case with VIR attending: Dr. PHaroldine LawsPA-c  Vascular Interventional Radiology

## 2016-10-08 NOTE — Discharge Instructions (Signed)
IR should call you to make an appointment for internalization of biliary drain within 2 weeks

## 2016-10-08 NOTE — Discharge Summary (Signed)
Date of Admission:  10/04/2016  Date of Discharge:  10/08/16    Patient Name:  Laurie [name]    Principal Diagnosis (required): [name] with metastasis and mechanical biliary obstruction    Hospital Problem List (required):  Active Hospital Problems    Diagnosis    Hyperbilirubinemia [E80.6]      Resolved Hospital Problems    Diagnosis   No resolved problems to display.           Principal Procedure During This Hospitalization (required):  biliary drain placement    Other Procedures Performed During This Hospitalization (required):  None        Consultations Obtained During This Hospitalization:  Hepatology  Interventional Radiology    Key consultant recommendations:  Hospice, keep biliary drain    Reason for Admission to the Hospital / History of Present Illness:  [place] with metastasis and mechanical biliary obstruction      Hospital Course by Problem (required):  1. HCC, MELD 24  Given increase in TBILI, patient is no longer a candidate for locoregional therapy or systemic chemotherapy(contraindicated in CP C with high MELD as it can precipitate liver failure) as per hepatology  Not a candidate for transplant due to metastatic [place] disease  IR  placed percutaneous biliary drain 10/07/16 to help reduce tbili for palliation. Hope would be to reduce bili to <5 to potentially offer systemic chemotherapy.  Given no obvious extrahepatic biliary obstruction on U/S, there is limited, if any, role in an ERCP.  Cont Ursodiol 15mg /kg to see if this can bring down her Bilirubin    2) Goals of Care - Metastatic HCC disease. Large tumor burden. Discussed with patient limited options and extent of tumor burden with extremely poor prognosis.  palliative care consultation placed  charity hospice care arranged    Tests Outstanding at Discharge Requiring Follow Up:  none    Discharge Condition (required):  Poor.    Key Physical Exam Findings at Discharge:  No significant physical examination findings at the time of  discharge.    Discharge Diet:  Low-protein.    Discharge Medications:     What To Do With Your Medications      START taking these medications       Add'l Info    bisacodyl 10 MG suppository   Commonly known as:  DULCOLAX   Insert 1 suppository (10 mg) rectally daily as needed for Constipation.    Quantity:  12 suppository   Refills:  0       hydrOXYzine HCL 25 MG tablet   Commonly known as:  ATARAX   Take 1 tablet (25 mg) by mouth every 2 hours as needed for Itching.    Quantity:  30 tablet   Refills:  0       lidocaine 5 % patch   Commonly known as:  LIDODERM   Apply 1 patch topically every 24 hours. Leave patch on for 12 hours, then remove for 12 hours.   Start taking on:  10/09/2016    Quantity:  30 patch   Refills:  0       ursodiol 300 MG capsule   Commonly known as:  ACTIGALL   Take 2 capsules (600 mg) by mouth 2 times daily.    Quantity:  60 capsule   Refills:  0         CONTINUE taking these medications       Add'l Info    acetaminophen 325 MG tablet  Commonly known as:  TYLENOL   Take 2 tablets (650 mg) by mouth every 6 hours as needed (pain).    Quantity:  60 tablet   Refills:  0       HYDROmorphone 2 MG tablet   Commonly known as:  DILAUDID   Take 1 tablet (2 mg) by mouth every 3 hours as needed for Severe Pain (Pain Score 7-10).    Quantity:  30 tablet   Refills:  0       losartan 25 MG tablet   Commonly known as:  COZAAR   Take 25 mg by mouth daily.    Refills:  0       metFORMIN 1000 MG tablet   Commonly known as:  GLUCOPHAGE   Take 1,000 mg by mouth 2 times daily (with meals).    Refills:  0       omeprazole 20 MG capsule   Commonly known as:  PRILOSEC   Take 20 mg by mouth daily.    Refills:  0       ondansetron 8 MG tablet   Commonly known as:  ZOFRAN   Take 1 tablet (8 mg) by mouth every 8 hours as needed for Nausea/Vomiting.    Quantity:  30 tablet   Refills:  0       traZODone 50 MG tablet   Commonly known as:  DESYREL   Take 1 tablet (50 mg) by mouth At bedtime as needed for Insomnia.     Quantity:  30 tablet   Refills:  0         STOP taking these medications          glimepiride 4 MG tablet   Commonly known as:  AMARYL       pioglitazone 30 MG tablet   Commonly known as:  ACTOS            Where to Get Your Medications      These medications were sent to [id number] - [facility] [place]  [name], [place] [address]    Hours:  Mon-Fri 10am-7pm, Sat 9:30am-6pm, Sun CLOSED Phone:  [phone]     bisacodyl 10 MG suppository    hydrOXYzine HCL 25 MG tablet    lidocaine 5 % patch    ursodiol 300 MG capsule         Please check with staff for printed prescription or if prescription was faxed to your pharmacy.     Bring a paper prescription for each of these medications     HYDROmorphone 2 MG tablet             Allergies:  No Known Allergies    Discharge Disposition:  Home with home hospice.    Discharge Code Status:  Do Not Attempt Resuscitation / full care  This is a change in the patient's code status compared to admission.  The reason for the change in code status is as follows:  progressive cancer    Follow Up Appointments:    Scheduled appointments:  No future appointments.    For appointments requested for after discharge that have not yet been scheduled, refer to the Post Discharge Referrals section of the After Visit Summary.    Discharging 61 Contact Information:  [name] phone triage at [phone].

## 2016-10-08 NOTE — Consults (Signed)
Gastroenterology/Hepatology Progress Note   GI Fellow/Author of Note: Zachary Neubert, DO  Attending: Jayakumar, MD  Current Hospital Stay:   3 days - Admitted on: 10/04/2016      Reason for follow-up: assessment of HCC and cirrhosis, new onset jaundice    Interval Events/Subjective:   -pending social work eval for charity hospice  -ursodiol started  -IR placed perc biliary drain about 300cc output    Medications:  Scheduled Meds  • cefTRIAXone (ROCEPHIN) IV  1,000 mg Once   • insulin lispro  1-10 Units 4x Daily WC   • lidocaine  1 patch Q24H   • lidocaine  1 patch Q24H   • polyethylene glycol  17 g Daily   • senna  2 tablet HS   • sodium chloride (PF)  3 mL Q8H   • ursodiol  600 mg BID     PRN Meds  • acetaminophen  650 mg Q4H PRN    Or   • acetaminophen  650 mg Q4H PRN    Or   • acetaminophen  650 mg Q4H PRN   • acetaminophen  650 mg Q6H PRN   • aluminum-magnesium-simethicone  30 mL Q6H PRN   • bisacodyl  10 mg Daily PRN   • dextrose  12.5 g PRN   • glucagon  1 mg Once PRN   • glucose  4 tablet PRN   • glucose  1 Tube PRN   • HYDROmorphone  2 mg Q4H PRN   • HYDROmorphone  8 mg Q4H PRN   • hydrOXYzine HCL  25 mg Q2H PRN   • magnesium hydroxide  30 mL Nightly PRN   • nalOXone  0.1 mg Q2 Min PRN   • ondansetron  4 mg Q6H PRN   • sodium chloride (PF)  3 mL PRN   • sodium chloride   Continuous PRN   • traZODone  50 mg Nightly PRN     IV Meds  • sodium chloride           Vitals signs:     Latest Entry  Range (last 24 hours)    Temperature: 97.5 °F (36.4 °C)  Temp  Avg: 98 °F (36.7 °C)  Min: 97.5 °F (36.4 °C)  Max: 98.6 °F (37 °C)    Blood pressure (BP): 117/76  BP  Min: 111/69  Max: 129/77    Heart Rate: 109  Pulse  Avg: 100.7  Min: 92  Max: 109    Respirations: 17  Resp  Avg: 17.3  Min: 14  Max: 20    SpO2: 95 %  SpO2  Avg: 98.3 %  Min: 94 %  Max: 100 %       No Data Recorded     Weight: 89.8 kg (197 lb 14.4 oz)  Percentage Weight Change (%): 0.74 %    Intake/Output       10/07/16 0600 - 10/08/16 0559 10/08/16 0600 -  10/09/16 0559      0600-1759 1800-0559 Total 0600-1759 1800-0559 Total       Intake    P.O.  --  360 360  --  -- --    I.V.  50  -- 50  --  -- --    NG/GT  --  10 10  --  -- --    Total Intake 50 370 420 -- -- --       Output    Urine  --  450 450  --  -- --      Drains  --  350 350  --  -- --    Total Output -- 800 800 -- -- --       Net I/O     50 -430 -380 -- -- --          Physical Exam:   Gen: cooperative, appears well, in NAD, extremely jaundiced  HEENT: Eyes - scleral icterus, MMM, O/P clear  Lungs: ctab, no wheezes, rales or crackles  Heart: RRR, normal S1,S2, no murmurs  Abd: +normoactive BS, Soft, distended, non-tender,  Ext: No edema  Skin: No spider angiomas or palmar erythema  Neuro: axo x3, No asterixis        Labs:       CBC  Recent Labs      10/07/16   0545  10/08/16   0540   WBC  13.1*  13.8*   HGB  8.9*  8.6*   HCT  30.9*  28.3*   PLT  512*  431*   SEG  74  80   LYMPHS  8  5   MONOS  9  8        Chemistry  Recent Labs      10/07/16   0545  10/08/16   0540   NA  132*  131*   K  3.6  3.9   CL  93*  93*   BICARB  21*  19*   BUN  12  14   CREAT  0.51  0.53   GLU  95  81   CA  9.2  9.3     Recent Labs      10/07/16   0545  10/08/16   0540   ALK  809*  761*   AST  94*  94*   ALT  42*  39*   TBILI  17.20*  16.48*   ALB  2.3*  2.2*          Coags  No results for input(s): PT, PTT, INR in the last 72 hours.      Radiology:   MR a/p 04/24  IMPRESSION:  1. Enlarging biopsy-proven large hepatocellular carcinoma resulting in mass effect upon intrahepatic and extrahepatic portal veins. Multiple small lesions in the right lobe, segments 2, 3 and 4 are concerning for additional malignancies.    2. New anasarca of the abdominal wall. No large ascites.    Abdo U/S (10/04/2016): LIVER:  21 cm in long axis. Heterogeneous mass involving the right and left liver lobes measuring approximately 15.9 x 15.4 x 13.3 cm, consistent with known hepatocellular carcinoma.   VESSELS:  Main portal vein diameter: 1mm in the porta  hepatis. Main portal vein flow: Patent with proper direction.  Right portal vein flow: Patent with proper direction. Left portal vein flow: Patent with properdirection. Hepatic veins: Patent  BILIARY: No intra or extra hepatic bile duct dilation.  OTHER: No ascites.  Spleen: Measures 10.7 cm.     CT Abdo/pelvis (09/14/2016):LUNG BASES: Please see today's chest CT.  LIVER/BILIARY:There is a large mass encompassing nearly the entirety of the right lobe of the liver including segment 4 as well. It measures at least 17.4 by 13.4 by 16.3 cm. The mass displaces the left portal vein and significantly attenuates the right portal vein but they do appear patent. Hepatic veins are significantly compressed by this mass as well. Status post cholecystectomy. Mild inflammation and trace ascites adjacent to the lateral liver.  PANCREAS: Unremarkable  SPLEEN: Unremarkable.  ADRENAL GLANDS: Unremarkable    KIDNEYS: Right lower pole cyst.  STOMACH/DUODENUM: Unremarkable  VASCULATURE: Minimal atherosclerotic calcifications.  LYMPHATIC: Multiple perihepatic and peripancreatic lymph nodes largest of which measures 1 cm in short axis.  SMALL & LARGE BOWEL: Unremarkable  BLADDER/PELVIC ORGANS: Calcified mesenteric lymph node in the right hemipelvis.  BONES/SOFT TISSUES: Fat containing umbilical and ventral wall hernias.  OTHER: None     CT chest (09/14/2016):  Cardiovascular: Unremarkable.  Pericardium: No significant effusion.  Lymph nodes: No enlarged intrathoracic nodes identified.  Airways: Relatively unremarkable.  Pleura: No effusion or pneumothorax.  Lungs: No consolidation. New nodular opacities in the lungs which measure up to 6 mm. Stable appearance of calcified granuloma in the left lower lobe.  Imaged upper abdomen: Elevated right hemidiaphragm and large liver mass. Please see concurrently performed CT abdomen/pelvis.  Regional skeleton: No acute findings.  Chest wall: No acute osseous abnormalities.      Prior Endoscopy:   EGD  (08/20/2016): No Varices seen Z-line regular at approx. 38 cm from central incisors. Stomach with mild, diffuse erythema and altered texture. Biopsies obtained in the gastric antrum and body to r/o H.pylori. Normal retroflexion in the cardia and fundus Normal examined portion of duodenum     Colonoscopy (08/20/2016): Mild internal and external hemorrhoids in the rectum, seen upon retroflexion Tortuous left colon Exam otherwise normal      Assessment and  Care plan:    1)HCC: NaMELD 24, CP C                        -Given increase in TBILI, patient is no longer a candidate for locoregional therapy or systemic chemotherapy (contraindicated in CP C with high MELD as it can precipitate liver failure)                        -Not a candidate for transplant due to metastatic [place] disease                        -IR  placed percutaneous biliary drain today to help reduce tbili for palliation. Hope would be to reduce bili to <5 to potentially offer systemic chemotherapy.                          -Given no obvious extrahepatic biliary obstruction on U/S, there is limited, if any, role in an ERCP.                        -Ursodiol 23m/kg to see if this can bring down her Bilirubin    2) Goals of Care - Metastatic HCC disease. Large tumor burden. Discussed with patient limited options and extent of tumor burden with extremely poor prognosis.                         -palliative care consultation placed    -reaching out to social work to evaluate patient for charity hospice care                 This patient was seen and discussed with Dr. JVida Roller     Electronically Signed by:  ZGlade Stanford DO  Gastroenterology/Hepatology Fellow  P: [phone]

## 2016-10-08 NOTE — Interdisciplinary (Addendum)
10/08/16 1045   Assessment   Assessment Type Progress/Follow-up     Social Work Note 10/08/16:    SW met with pt, family, and Curi from [facility]. Per family and CM RN Nilda Simmer, pt is ready to go today. Per [employer], daughter can transport pt home today. Additionally, [facility] will deliver DME once pt is home and has discussed it further with her family. Pt still undecided on code status so POLST not signed at this time. SW paged MD Zayets to get discharge orders. Once discharge summary is signed, Curi asked for it to be faxed to [phone]. SW will do that as requested. No other SW needs. Pt signed on with [facility] and going home. Stent in and ready to go. SW updated CM RN [employer]. SW to continue to be available for support and resource needs while inpatient.     ADDENDUM 1400: Spoke with CM RN Chrissy Berkes. Apparently, discharge meds are waiting at [facility] for pt. However pt is unfunded so hospice needs to provide. SW called [facility] and per [facility], they are providing meds, but the meds at [facility] are to keep pt comfortable until the hospice delivers her meds late tonight or tomorrow morning. SW spoke with Chrissy again and asked her to speak with MD regarding another option for pain control until hospice delivers meds or to see if CM needs to do a request for financial assistance for the meds for the pt. Additionally, Curi said the pt doesn't know which residence she is going to stay: her husband's (facesheet) or her sister's. She will tell Curi once she knows (later today). Pt has discharge orders and is ready to go. SW will work with Chrissy to get pt discharged safely.    ADDENDUM 1530: SW spoke with pt and daughter and got clarification regarding oxygen. Pt doesn't need oxygen at home or to go home. Additionally, daughter states that pt has a couple pills for pain and itch at home to tie pt over until hospice delivers the meds either late tonight or tomorrow  morning. Pt waiting to get education from RN regarding emptying the drain bag. SW updated CM and RN. SW called Curi at [facility] and left a message to update her. Pt should be leaving soon for home.         [name], MSW, [employer] Social Worker I  [phone]

## 2016-10-08 NOTE — Interdisciplinary (Signed)
10/08/16 1543   Discharge Planning Needs   Planned living arrangements after discharge: address: [address], [place] [address]   Does this patient have CM discharge planning needs? Yes   CM Needs Met? Yes   [place]: [facility]   Phone: [phone]     Patient to transported by family to above address.  Forde Radon, RN

## 2016-10-08 NOTE — Plan of Care (Signed)
Discharge instructions given to patients daughter. Pt brought down to the front for discharge

## 2016-10-08 NOTE — Plan of Care (Signed)
Discharge Pharmacy made aware patient is not picking up medications per social worker medications are arranged with patients hospice

## 2016-10-12 LAB — STERILE FLUID CULTURE W/GRAM STAIN, AEROBIC: Fluid Culture Result: NO GROWTH

## 2016-10-23 ENCOUNTER — Ambulatory Visit (HOSPITAL_BASED_OUTPATIENT_CLINIC_OR_DEPARTMENT_OTHER): Payer: Self-pay

## 2016-10-23 ENCOUNTER — Inpatient Hospital Stay
Admission: AC | Admit: 2016-10-23 | Discharge: 2016-11-13 | DRG: 082 | Disposition: E | Payer: Self-pay | Attending: Trauma Surgery | Admitting: Trauma Surgery

## 2016-10-23 ENCOUNTER — Ambulatory Visit (HOSPITAL_COMMUNITY): Payer: Self-pay

## 2016-10-23 ENCOUNTER — Inpatient Hospital Stay (HOSPITAL_COMMUNITY): Payer: Self-pay

## 2016-10-23 ENCOUNTER — Inpatient Hospital Stay (HOSPITAL_BASED_OUTPATIENT_CLINIC_OR_DEPARTMENT_OTHER): Payer: Self-pay

## 2016-10-23 DIAGNOSIS — Q7649 Other congenital malformations of spine, not associated with scoliosis: Secondary | ICD-10-CM

## 2016-10-23 DIAGNOSIS — S0633AA Contusion and laceration of cerebrum, unspecified, with loss of consciousness status unknown, initial encounter (CMS-HCC): Secondary | ICD-10-CM | POA: Diagnosis present

## 2016-10-23 DIAGNOSIS — C220 Liver cell carcinoma: Secondary | ICD-10-CM

## 2016-10-23 DIAGNOSIS — Z438 Encounter for attention to other artificial openings: Secondary | ICD-10-CM

## 2016-10-23 DIAGNOSIS — Z79899 Other long term (current) drug therapy: Secondary | ICD-10-CM

## 2016-10-23 DIAGNOSIS — Z4659 Encounter for fitting and adjustment of other gastrointestinal appliance and device: Secondary | ICD-10-CM

## 2016-10-23 DIAGNOSIS — S02119A Unspecified fracture of occiput, initial encounter for closed fracture: Secondary | ICD-10-CM

## 2016-10-23 DIAGNOSIS — R402113 Coma scale, eyes open, never, at hospital admission: Secondary | ICD-10-CM | POA: Diagnosis present

## 2016-10-23 DIAGNOSIS — R402213 Coma scale, best verbal response, none, at hospital admission: Secondary | ICD-10-CM | POA: Diagnosis present

## 2016-10-23 DIAGNOSIS — W19XXXA Unspecified fall, initial encounter: Secondary | ICD-10-CM

## 2016-10-23 DIAGNOSIS — S06360A Traumatic hemorrhage of cerebrum, unspecified, without loss of consciousness, initial encounter: Secondary | ICD-10-CM | POA: Diagnosis present

## 2016-10-23 DIAGNOSIS — S0211GA Other fracture of occiput, right side, initial encounter for closed fracture: Secondary | ICD-10-CM | POA: Diagnosis present

## 2016-10-23 DIAGNOSIS — E119 Type 2 diabetes mellitus without complications: Secondary | ICD-10-CM | POA: Diagnosis present

## 2016-10-23 DIAGNOSIS — C799 Secondary malignant neoplasm of unspecified site: Secondary | ICD-10-CM | POA: Diagnosis present

## 2016-10-23 DIAGNOSIS — R918 Other nonspecific abnormal finding of lung field: Secondary | ICD-10-CM

## 2016-10-23 DIAGNOSIS — M5136 Other intervertebral disc degeneration, lumbar region: Secondary | ICD-10-CM

## 2016-10-23 DIAGNOSIS — W1830XA Fall on same level, unspecified, initial encounter: Secondary | ICD-10-CM | POA: Diagnosis present

## 2016-10-23 DIAGNOSIS — M47896 Other spondylosis, lumbar region: Secondary | ICD-10-CM

## 2016-10-23 DIAGNOSIS — M8588 Other specified disorders of bone density and structure, other site: Secondary | ICD-10-CM

## 2016-10-23 DIAGNOSIS — J9690 Respiratory failure, unspecified, unspecified whether with hypoxia or hypercapnia: Secondary | ICD-10-CM | POA: Diagnosis present

## 2016-10-23 DIAGNOSIS — T07XXXA Unspecified multiple injuries, initial encounter: Secondary | ICD-10-CM

## 2016-10-23 DIAGNOSIS — M47898 Other spondylosis, sacral and sacrococcygeal region: Secondary | ICD-10-CM

## 2016-10-23 DIAGNOSIS — K7291 Hepatic failure, unspecified with coma: Secondary | ICD-10-CM | POA: Diagnosis present

## 2016-10-23 DIAGNOSIS — R402432 Glasgow coma scale score 3-8, at arrival to emergency department: Secondary | ICD-10-CM

## 2016-10-23 DIAGNOSIS — C7801 Secondary malignant neoplasm of right lung: Secondary | ICD-10-CM

## 2016-10-23 DIAGNOSIS — M160 Bilateral primary osteoarthritis of hip: Secondary | ICD-10-CM

## 2016-10-23 DIAGNOSIS — Z79891 Long term (current) use of opiate analgesic: Secondary | ICD-10-CM

## 2016-10-23 DIAGNOSIS — Z66 Do not resuscitate: Secondary | ICD-10-CM | POA: Diagnosis present

## 2016-10-23 DIAGNOSIS — R402313 Coma scale, best motor response, none, at hospital admission: Secondary | ICD-10-CM | POA: Diagnosis present

## 2016-10-23 DIAGNOSIS — Z4682 Encounter for fitting and adjustment of non-vascular catheter: Secondary | ICD-10-CM

## 2016-10-23 DIAGNOSIS — R188 Other ascites: Secondary | ICD-10-CM

## 2016-10-23 DIAGNOSIS — Z515 Encounter for palliative care: Secondary | ICD-10-CM | POA: Diagnosis present

## 2016-10-23 DIAGNOSIS — G936 Cerebral edema: Secondary | ICD-10-CM

## 2016-10-23 DIAGNOSIS — I878 Other specified disorders of veins: Secondary | ICD-10-CM

## 2016-10-23 DIAGNOSIS — S065X9A Traumatic subdural hemorrhage with loss of consciousness of unspecified duration, initial encounter: Secondary | ICD-10-CM

## 2016-10-23 DIAGNOSIS — C7802 Secondary malignant neoplasm of left lung: Secondary | ICD-10-CM

## 2016-10-23 DIAGNOSIS — Z7984 Long term (current) use of oral hypoglycemic drugs: Secondary | ICD-10-CM

## 2016-10-23 DIAGNOSIS — S062X9A Diffuse traumatic brain injury with loss of consciousness of unspecified duration, initial encounter: Secondary | ICD-10-CM

## 2016-10-23 LAB — UR DRUGS OF ABUSE SCREEN
Amphetamines Screen: NEGATIVE
Barbiturates Screen: NEGATIVE
Benzodiazepine Screen: NEGATIVE
Cocaine Screen: NEGATIVE
Methadone Screen: NEGATIVE
Oxycodone Screen: NEGATIVE
Phencyclidine Screen: NEGATIVE
THC Screen: NEGATIVE

## 2016-10-23 LAB — BASIC METABOLIC PANEL, BLOOD
Anion Gap: 24 mmol/L — ABNORMAL HIGH (ref 7–15)
Anion Gap: 20 mmol/L — ABNORMAL HIGH (ref 7–15)
BUN: 54 mg/dL — ABNORMAL HIGH (ref 8–23)
BUN: 51 mg/dL — ABNORMAL HIGH (ref 8–23)
Bicarbonate: 12 mmol/L — ABNORMAL LOW (ref 22–29)
Bicarbonate: 13 mmol/L — ABNORMAL LOW (ref 22–29)
Calcium: 8.5 mg/dL (ref 8.2–9.6)
Calcium: 8.5 mg/dL (ref 8.2–9.6)
Chloride: 90 mmol/L — ABNORMAL LOW (ref 98–107)
Chloride: 99 mmol/L (ref 98–107)
Creatinine: 2.29 mg/dL
Creatinine: 2.09 mg/dL
GFR: 26 mL/min
GFR: 28 mL/min
Glucose: 114 mg/dL — ABNORMAL HIGH (ref 70–99)
Glucose: 109 mg/dL — ABNORMAL HIGH (ref 70–99)
Potassium: 5.5 mmol/L — ABNORMAL HIGH (ref 3.5–5.1)
Potassium: 5.5 mmol/L — ABNORMAL HIGH (ref 3.5–5.1)
Sodium: 126 mmol/L — ABNORMAL LOW (ref 136–145)
Sodium: 132 mmol/L — ABNORMAL LOW (ref 136–145)

## 2016-10-23 LAB — APTT, BLOOD
PTT: 39 s — ABNORMAL HIGH (ref 25–34)
PTT: 41 s — ABNORMAL HIGH (ref 25–34)

## 2016-10-23 LAB — VBG+O2SAT+O2HBV
BE, Ven: -15.2 mmol/L
FIO2, Ven: 100 %
HCO3, Ven: 13 mmol/L
O2 Hgb, Ven: 96.1 — ABNORMAL HIGH (ref 40.0–70.0)
O2 Sat, Ven: 99.7 %
Temp, Ven: 36.1 'C
pCO2, Ven (T): 31 mmHg — ABNORMAL LOW (ref 40–52)
pCO2, Ven (Uncorr): 32 mmHg — ABNORMAL LOW (ref 40–52)
pH, Ven (T): 7.19 — ABNORMAL LOW (ref 7.33–7.40)
pH, Ven (Uncorr): 7.18 — ABNORMAL LOW (ref 7.33–7.40)
pO2, Ven (T): 104 mmHg — ABNORMAL HIGH (ref 25–44)
pO2, Ven (Uncorr): 110 mmHg — ABNORMAL HIGH (ref 25–44)

## 2016-10-23 LAB — LIVER PANEL, BLOOD
ALT (SGPT): 21 U/L
AST (SGOT): 32 U/L
Albumin: 2 g/dL — ABNORMAL LOW (ref 3.5–5.2)
Alkaline Phos: 319 U/L
Bilirubin, Dir: 4.4 mg/dL — ABNORMAL HIGH (ref <0.2)
Bilirubin, Tot: 5.99 mg/dL — ABNORMAL HIGH (ref <1.2)
Total Protein: 6.8 g/dL (ref 6.0–8.0)

## 2016-10-23 LAB — HEMOGRAM, BLOOD
Absolute Nucleated RBC: 0.1 10*3/uL (ref <0.1)
Absolute Nucleated RBC: 0.1 10*3/uL (ref <0.1)
Hct: 27.3 %
Hct: 27.3 %
Hgb: 8.5 gm/dL
Hgb: 8.4 gm/dL
MCH: 27.8 pg (ref 26.0–32.0)
MCH: 28 pg (ref 26.0–32.0)
MCHC: 31.1 g/dL — ABNORMAL LOW (ref 32.0–36.0)
MCHC: 30.8 g/dL — ABNORMAL LOW (ref 32.0–36.0)
MCV: 89.2 um3 (ref 79.0–95.0)
MCV: 91 um3 (ref 79.0–95.0)
MPV: 8.8 fL — ABNORMAL LOW (ref 9.4–12.4)
MPV: 10 fL (ref 9.4–12.4)
NRBC: 1 /100 WBC (ref <1)
NRBC: 1 /100 WBC (ref <1)
Plt Count: 251 10*3/uL (ref 140–370)
Plt Count: 316 10*3/uL (ref 140–370)
RBC: 3.06 10*6/uL
RBC: 3 10*6/uL
RDW: 16.3 % — ABNORMAL HIGH (ref 12.0–14.0)
RDW: 17 % — ABNORMAL HIGH (ref 12.0–14.0)
WBC: 18.5 10*3/uL — ABNORMAL HIGH (ref 4.0–10.0)
WBC: 21.6 10*3/uL — ABNORMAL HIGH (ref 4.0–10.0)

## 2016-10-23 LAB — PREPARE FFP/THAWED PLASMA
Dispense Status: TRANSFUSED
Expiration: 201805112359
Fresh Frozen Plasma: TRANSFUSED
Type: A POS

## 2016-10-23 LAB — PROTHROMBIN TIME, BLOOD
INR: 2.2
INR: 1.9
PT,Patient: 23.5 s — ABNORMAL HIGH (ref 9.7–12.5)
PT,Patient: 20.3 s — ABNORMAL HIGH (ref 9.7–12.5)

## 2016-10-23 LAB — PHOSPHORUS, BLOOD: Phosphorous: 5.6 mg/dL — ABNORMAL HIGH (ref 2.7–4.5)

## 2016-10-23 LAB — TYPE & SCREEN
ABO/RH: A POS
Antibody Screen: NEGATIVE

## 2016-10-23 LAB — GLUCOSE (POCT)
Glucose (POCT): 114 mg/dL — ABNORMAL HIGH (ref 70–99)
Glucose (POCT): 118 mg/dL — ABNORMAL HIGH (ref 70–99)

## 2016-10-23 LAB — ALCOHOL, BLOOD: Alcohol: <11 mg/dL

## 2016-10-23 LAB — BLOOD GAS HGB/HCT, VENOUS
Hct (Est), Ven: 26 %
Hgb, Ven: 8.7 g/dL — ABNORMAL LOW (ref 14.0–17.0)

## 2016-10-23 LAB — MAGNESIUM, BLOOD: Magnesium: 2.3 mg/dL (ref 1.7–2.3)

## 2016-10-23 LAB — ABO/RH CONFIRMATION: ABO/RH: A POS

## 2016-10-23 MED ORDER — SODIUM CHLORIDE 0.9 % IV SOLN
10.0000 mg | Freq: Every day | INTRAVENOUS | Status: DC
Start: 2016-10-23 — End: 2016-10-24
  Administered 2016-10-23: 10 mg via INTRAVENOUS
  Filled 2016-10-23: qty 1

## 2016-10-23 MED ORDER — GLUCOSE 4 GM PO CHEW (CUSTOM)
4.0000 | CHEWABLE_TABLET | ORAL | Status: DC | PRN
Start: 2016-10-23 — End: 2016-10-24

## 2016-10-23 MED ORDER — FAMOTIDINE IN NACL 20 MG/50ML IV SOLN
20.0000 mg | Freq: Two times a day (BID) | INTRAVENOUS | Status: DC
Start: 2016-10-23 — End: 2016-10-24
  Administered 2016-10-23: 20 mg via INTRAVENOUS
  Filled 2016-10-23: qty 50

## 2016-10-23 MED ORDER — FENTANYL CITRATE (PF) 100 MCG/2ML IJ SOLN
INTRAMUSCULAR | Status: AC
Start: 2016-10-23 — End: 2016-10-23
  Filled 2016-10-23: qty 2

## 2016-10-23 MED ORDER — PROPOFOL 1000 MG/100ML IV EMUL
0.0000 ug/kg/min | INTRAVENOUS | Status: DC
Start: 2016-10-23 — End: 2016-10-24

## 2016-10-23 MED ORDER — FENTANYL IV BOLUS FROM BAG
25.0000 ug | INTRAMUSCULAR | Status: DC | PRN
Start: 2016-10-23 — End: 2016-10-24

## 2016-10-23 MED ORDER — IOHEXOL 350 MG/ML IV SOLN
100.00 mL | Freq: Once | INTRAVENOUS | Status: AC
Start: 2016-10-23 — End: 2016-10-23
  Administered 2016-10-23: 100 mL via INTRAVENOUS
  Filled 2016-10-23: qty 100

## 2016-10-23 MED ORDER — FENTANYL INFUSION 10 MCG/ML (PREMADE)
0.0000 ug/h | Status: DC
Start: 2016-10-23 — End: 2016-10-24

## 2016-10-23 MED ORDER — GLUCAGON HCL (RDNA) 1 MG IJ SOLR
1.0000 mg | Freq: Once | INTRAMUSCULAR | Status: DC | PRN
Start: 2016-10-23 — End: 2016-10-24

## 2016-10-23 MED ORDER — DEXTROSE 50 % IV SOLN
12.5000 g | INTRAVENOUS | Status: DC | PRN
Start: 2016-10-23 — End: 2016-10-24

## 2016-10-23 MED ORDER — SODIUM CHLORIDE 0.9 % IV SOLN
INTRAVENOUS | Status: DC
Start: 2016-10-23 — End: 2016-10-24
  Administered 2016-10-23: 15:00:00 via INTRAVENOUS

## 2016-10-23 MED ORDER — INSULIN REGULAR HUMAN 100 UNIT/ML IJ SOLN
1.0000 [IU] | Freq: Four times a day (QID) | INTRAMUSCULAR | Status: DC
Start: 2016-10-23 — End: 2016-10-24

## 2016-10-23 MED ORDER — SODIUM CHLORIDE 0.9 % IV SOLN
Freq: Once | INTRAVENOUS | Status: DC | PRN
Start: 2016-10-23 — End: 2016-10-24

## 2016-10-23 MED ORDER — DEXTROSE (DIABETIC USE) 40 % OR GEL
1.0000 | ORAL | Status: DC | PRN
Start: 2016-10-23 — End: 2016-10-24

## 2016-10-23 MED ORDER — NALOXONE HCL 0.4 MG/ML IJ SOLN
0.1000 mg | INTRAMUSCULAR | Status: DC | PRN
Start: 2016-10-23 — End: 2016-10-24

## 2016-10-23 NOTE — Interdisciplinary (Signed)
2230: Multiple family/friends in and out to visit w/ pt. Daughter Perla @ BS and requested information on comfort care procedure. Informed her that once they were ready to make the decision we would work quickly to withdraw life-sustaining tubes/lines and provide comfort care. It was made clear that family would be allowed to be at bedside if they would like. Per daughter, plans are to withdraw once grandparents have arrived.    2240: Spoke w/ Dr. Mckinley Jewel in person concerning recent text page and recent conversation w/ family. Plans for comfort care in near future and will D/C all orders not pertaining to comfort care.

## 2016-10-23 NOTE — Interdisciplinary (Signed)
10/27/2016 1514   Patient Information   Why is Patient in the Hospital? 82 F, ground level fall this morning. Patient is in hospice due to advanced [facility] with diffuse mets. After the fall, patient was noted to be unresponsive, GCS 3. Patient is A&O x 4 at baseline.    Primary Contact Name [facility]   Primary Contact Number Cell PJPET:[phone]  Home phone: [phone]   Permission to Contact Yes   Discharge Planning   Patient/Family Are In Agreement With Discharge Plan Per Goals of care note, "family states that they would like to keep her on the ventilator until grandparents arrive tomorrow from [place]. At such time, they would like compassionate extubation."   Initial Assessment Completed   CM Initial Assessment Completed

## 2016-10-23 NOTE — Consults (Signed)
Neurosurgery Note  Patient: 79, MRN [id number]  Service: No service for patient encounter.    Attending Provider: No att. providers found  Today's date: 10/13/2016        Hospital Day:   0 days - Admitted on: 10/16/2016  Reason for consultation: traumatic intracranial hematomas    ID: Laurie [name] is a 23F found down, ground level fall from hospice/DNR, metastatic [place]. AOx4 at baseline, GCS 3 when found and GCS5 on arrival, intubated around 1:30pm. Has advanced liver failure, INR 2.2 on arrival. Right pupil was 7 and fixed on arrival around 12:50pm.    Interim: intubated, Roc & Etomidate given    Scheduled meds    PRN meds and last dose   fentaNYL  25 mcg Q5 Min PRN       IV meds   fentaNYL         NEUROLOGIC  New neuroimaging:     Exam:  Intubated, no sedation but recently given Roc & Etomidate for intubation  Right pupil 7 and fixed, Left pupil 4 minimally reactive. No corneals, cough or gag.  No response to noxious x4    Current Facility-Administered Medications   Medication Dose Frequency     Current Facility-Administered Medications   Medication Dose Frequency    fentaNYL  0-200 mcg/hr Continuous    And    fentaNYL  25 mcg Q5 Min PRN     Current Facility-Administered Medications   Medication Dose Frequency     Current Facility-Administered Medications   Medication Dose Frequency     Current Facility-Administered Medications   Medication Dose Frequency     Other medications:  No Data Recorded  No Data Recorded  No results for input(s): NA in the last 72 hours.  CSF studies:   No results for input(s): COLORCSF, RBCCSF, WBCCSF, CFPOL, CFLYM, CFMAC, GLUCSF, TPCSF in the last 72 hours.    CARDIOVASCULAR  No Data Recorded  Current Facility-Administered Medications   Medication Dose Frequency     Current Facility-Administered Medications   Medication Dose Frequency       PULMONARY  No Data Recorded      Ventilator settings:    Sprint trials: No results for input(s): ARTPH, ARTPCO2, ARTPO2, ARTHC03, ARTBE,  ARTO2SAT in the last 72 hours.    FEN / GI  No results for input(s): NA, K, CL, BICARB, BUN, CREAT, GLU, [place], MG, PHOS, IONCA, OSMO, LACT in the last 72 hours.    Invalid input(s): CAI2  No results for input(s): AST, ALT, TBILI, DBILI, ALK, ALB, PREALB in the last 72 hours.  Intake/Output     None         fentaNYL       Diet:     Current Facility-Administered Medications   Medication Dose Frequency     Last BM:     ID / HEME  No Data Recorded  Recent Labs      10/20/2016   1312   WBC  18.5*   HGB  8.5   HCT  27.3   PLT  251   MCV  89.2   PT  23.5*   PTT  39*   INR  2.2     No results for input(s): APPEARUA, GLUCOSEUA, BILIUA, KETONEUA, SGUA, BLOODUA, PHUA, PROTEINUA, UROBILUA, NITRITEUA, LEUKESTUA, WBCUA, RBCUA, HYALINEUA in the last 72 hours.  No results found for: BLOODCULT, URINECULTURE  MRSA screen:  Micro:  Abx:  Current Facility-Administered Medications   Medication Dose Frequency  Blood Products:    ENDOCRINE  No results for input(s): GLUCPOCT in the last 72 hours.  No results for input(s): A1C in the last 72 hours.  Current Facility-Administered Medications   Medication Dose Frequency     Current Facility-Administered Medications   Medication Dose Frequency       PROPHYLAXIS:  Current Facility-Administered Medications   Medication Dose Frequency     Current Facility-Administered Medications   Medication Dose Frequency     Last LE Doppler:     A/P/Recs: Discussed patient's wishes at length with family. They articulated that she would not want any surgical intervention that would leave her dependent, and that they wish to decline surgical intervention at this time. They have elected to proceed with maximal medical measures until additional family arrives in a few hours, and then transition to comfort care.  - Continue with maximal medical measures including coagulopathy correction, seizure control, hypertonics, and blood pressure control per primary team  - Please re-consult if Neurosurgical intervention  reconsidered    Discussed with Chief, to be staffed with Attending

## 2016-10-23 NOTE — Progress Notes (Signed)
DEATH NOTE    Circumstances of Determination of Death  I was called by the nursing staff to confirm that the patient had expired.      The patient was determined to have expired, based on the following findings:  The patient did not show any visible signs of spontaneous breathing.  The patient did not have any palpable carotid or peripheral pulses.  There were no audible heart sounds upon auscultation.    The time of death was recorded as 23:44.    Family and Caregiver Notification  The patient's family was present at the bedside at the time of the patient's death.    Autopsy Request and Organ Donation  Permission to obtain an autopsy will be discussed after determination if this is a medical examiner's case    I have not yet contacted the organ donation service to review the case for any potential organ donations.

## 2016-10-23 NOTE — Interdisciplinary (Signed)
11/08/2016 1609   Assessment   Assessment Type Initial   Referral Information   Referral Type Discharge Planning   Social Assessment   Why is Patient in the Hospital? 33 F, ground level fall this morning. Patient is in hospice due to advanced [facility] with diffuse mets. After the fall, patient was noted to be unresponsive, GCS 3. Patient is A&OX4 at baseline.    Prior to Level of Function Needed Some Assist with Mobility   Primary Caretaker(s) Family  (Pt is living with her sister Delray Alt since being on hospice service. She began in-home hospice about a week ago. )   Primary Contact Name [facility]    Primary Contact Number [phone]    Primary Contact Relationship Child   Permission to Contact Yes   Secondary Contact Name [facility]   Secondary Contact Relationship Child   Interpreter Used? Not Needed   Cultural/Religious Beliefs Catholic   Social Determinates of Health   Living Arrangements Family members  (Lives with sister)   Support Systems Family member(s);Friends / neighbors;[place] Yes  (hospice)   Has discharge transport been arranged? No   Patient/Family Are In Agreement With Discharge Plan Plan is too continue full care until her parents Su Hoff from [place] tomorrow. Once family is present the plan is for compassionate extubation.    Plans/Interventions/Discharge   Plan/Interventions Instrumental interventions (i.e. letters, SDI, FMLA.Marland Kitchen)   Discharge Information for Patient - THIS GROUP FILES TO THE PATIENT'S AVS   Social Worker Name and Phone Number: Denny Levy [phone]      SW met with the Pt's daughters Meredith Pel and Bladen at bedside. They appear to be coping well and have family support at this difficult time. SW provided them with information regarding the mortuary process as they had questions about what to expect once the Pt passes. SW provided them with work excuse letters and encouraged them to have SW paged should they need anything  further.     SW to continue to follow.     Loman Chroman, LCSW  Clinical Social Worker   [phone]

## 2016-10-23 NOTE — Plan of Care (Signed)
Problem: Tissue Perfusion, Cerebral - Altered  Goal: Absence of continued or acute neurologic deterioration signs and symptoms  Outcome: Unable to meet goal at this time.  GCS 3T. Pupils non reactive l>r. DNR,  per family plan to remove ETT after pt's parents come to see pt    Problem: Breathing Pattern - Ineffective  Goal: Respiratory rate, rhythm and depth return to baseline  Outcome: Unable to meet goal at this time.  Fio2 weaned as tol

## 2016-10-23 NOTE — Procedures (Signed)
Humboldt is a 51 year old unknown patient.  No diagnosis found.  No past medical history on file.  Weight 90 kg (198 lb 6.6 oz).    Intubation  Date/Time: 11/09/2016 2:11 PM  Performed by: Roger Shelter  Authorized by: Madelynn Done [facility]   Consent: The procedure was performed in an emergent situation.  Risks and benefits: risks, benefits and alternatives were discussed  Consent given by: family.  Required items: required blood products, implants, devices, and special equipment available  Patient identity confirmed: hospital-assigned identification number  Time out: Immediately prior to procedure a "time out" was called to verify the correct patient, procedure, equipment, support staff and site/side marked as required.  Indications: airway protection  Intubation method: video-assisted  Patient status: paralyzed (RSI)  Preoxygenation: nonrebreather mask  Sedatives: etomidate  Paralytic: rocuronium  Laryngoscope size: Miller 3  Tube size: 7.0 mm  Tube type: cuffed  Number of attempts: 1  Cords visualized: yes  Post-procedure assessment: chest rise,  ETCO2 monitor,  equal bilateral breath sounds and chest x-ray  Breath sounds: equal  Cuff inflated: yes  ETT to lip: 20 cm  Tube secured with: ETT holder  Chest x-ray interpreted by me.  Chest x-ray findings: endotracheal tube in appropriate position  Patient tolerance: Patient tolerated the procedure well with no immediate complications          Roger Shelter, MD  10/26/2016

## 2016-10-23 NOTE — Interdisciplinary (Signed)
Family at bedside stated that the patient was moving her hand. Assessed patient and initial movement seemed to be timed w/ respirations. Noxious stimuli applied and withdrawal motion was noted to RUE. Pt suctioned and pupils assessed w/ no other changes. Pt's cough and gag are nonexistent and pt continues to exhibit anisocoria w/ nonreactive/sluggish pupils

## 2016-10-23 NOTE — Interdisciplinary (Signed)
Trauma pager text paed and informed of UOP<30 x 2 hours. Also, informed via page that no f/u ABG drawn since intubation and next lab draw @ 0000. Awaiting response.

## 2016-10-23 NOTE — H&P (Signed)
TRAUMA HISTORY AND PHYSICAL    Attending MD: Dr Otho Bellows: Mliss Sax  Name: Laurie [name]  DOB: [birth year]  Real MRN: [id number]    Mechanism of Trauma: Fall from ground level    History of Present Illness:  66 F, ground level fall this morning. Patient is in hospice due to advanced [facility] with diffuse mets. After the fall, patient was noted to be unresponsive, GCS 3. Patient is A&O x 4 at baseline.     Past Medical and Surgical History:    [place] with diffuse mets  Diabetes    Allergies: NKDA    Medications:   acetaminophen (TYLENOL) 325 MG tablet     bisacodyl (DULCOLAX) 10 MG suppository     HYDROmorphone (DILAUDID) 2 MG tablet     hydrOXYzine HCL (ATARAX) 25 MG tablet     lidocaine (LIDODERM) 5 % patch     losartan (COZAAR) 25 MG tablet     metFORMIN (GLUCOPHAGE) 1000 MG tablet     omeprazole (PRILOSEC) 20 MG capsule     ondansetron (ZOFRAN) 8 MG tablet     traZODone (DESYREL) 50 MG tablet     ursodiol (ACTIGALL) 300 MG capsule      Social History  Alcohol: no  Tobacco: no  Illicit: no    Tetanus shot: unknown    Family History: unable to assess    Review of Systems:   Unable to assess    Vitals:     Temp: 96.9  BP: 124/66  HR: 69  RR: 22  SpO2: 100%    Physical Exam:  General Appearance: airway unobstructed, c-collar in place   Neuro: GCS 7 (eyes not opening spontaneously, localizes to pain, moaning), some spontaneous movement noted of RUE.   Eyes: Conjunctivae and corneas clear. Right pupil fixed at 7 mm, left pupil 4 mm minimally reactive.  Ears: no frank blood or hemotympanum  Nose: Atraumatic  Mouth: Airway intact; otherwise atraumatic; no malocclusion  Neck: No c spine step offs or deformities noted  Head: No cephalohematoma or deformities noted  Back: No T/L-spine step-offs or deformities noted    Heart: RRR  Lungs: Clear to auscultation bilaterally; no chest wall tenderness to palpation noted  Abdomen: obese, soft, non-tender, no rebound/guarding, well healed midline incision, biliary  drain with dark fluid draining    Pelvis: Stable to compression; negative for TTP  Rectal: Good tone, no gross blood.  MSK: Motor strength grossly intact in all extremities.     LUE negative for swelling, deformities, abrasions, strength unable to assess   LLE: negative for swelling, deformities, abrasions, strength unable to assess   RLE: negative for swelling, deformities, abrasions, strength unable to assess   RUE: negative for swelling, deformities, abrasions, strength unable to assess    Pulses: 2+ throughout   Skin: No lacerations, no abrasions.    Labs and Other Data:  pending    Assessment and Care Plan:   51 year old female with history of end stage HCC presents after ground level fall with obvious obtundation. Fixed blown pupil, concerning for intracranial hemorrhage. Patient emergently intubated for airway protection.    CXR/PXR  CT head/C-Spine  CT abd/pelvis  CBC, CMP, coags, VBG Labs    Admit to Trauma ICU      Silvestre Gunner, MD, PhD  Emergency Medicine, PGY-I

## 2016-10-23 NOTE — Goals of Care (Signed)
GOALS OF CARE / ADVANCE CARE PLANNING CONVERSATION NOTE    A discussion was held with the patient and/or their surrogate decision maker regarding advanced care planning and goals of care.    Date and time of goals of care discussion:  10/18/2016 1:45 PM    Persons present:  Family decision maker, nurse    What is the name of the patient's decision maker?  [facility]    Has the patient made their wishes clear to their surrogate decision maker?  Yes    Contact information of patient's decision maker:  Cell phone: [phone]  Home phone: [phone]    The following topics were discussed:  patient's wishes and desires and medical team's recommendations    Details of discussion (address all topics identified above):    Patient would wish to not be on life support, she was on hospice for ESLD 2/2 hepatic meds   prior to admission. She would not want CPR.  When asked about duration of ventilator, family states that they would like to keep her on the ventilator until grandparents arrive tomorrow from [place]. At such time, they would like compassionate extubation.    What are the patient's priorities regarding quality of life?  Comfort    What are the anticipated outcomes, and were they communicated to the patient and/or surrogate decision maker?  Likely a Terminal Event    After today's discussion, the patient's code status is confirmed as:  Do Not Attempt Resuscitation / full care.    Total time spent face-to-face with patient and/or surrogate conducting advance care planning:  35 minutes.    Luciana Axe MD  Fellow  [place] Department of Surgery  Division of Trauma, Surgical Critical Care, [place], Acute Care Surgery  Pager: [phone]  1:49 PM5/04/2017

## 2016-10-23 NOTE — Progress Notes (Signed)
Interval prog note:  Family discussing with trauma and NSG team, now DNR, full care. Plan for potential compassionate extubation tomorrow when grandparents arrive.     CT head:   -Large bilat (R>L) acute subdural cerebral hemispheric convexity hematoma with more focal component along the right middle cranial fossa laterally.   -Right temporal lobe intraparenchymal hemorrhagic contusion with surrounding vasogenic edema.   -Uncal herniation with crowding and mass effect on the midbrain.  -Nondisplaced right occipital skull fracture with prominent occipital scalp soft tissue swelling.    CT head shows occipital fracture potentially involving V3 vertebral artery. Holding off on CTA pending updated goals of care discussion    INR 2.2, FFP 1 unit ordered after discussion with Dr. Heide Spark.     Decision maker: Laurie [name] (see Dr. Maudry Diego goals of care note)    Dispo: admit ICU with plan for potential compassionate extubation tonight

## 2016-10-24 ENCOUNTER — Other Ambulatory Visit (HOSPITAL_BASED_OUTPATIENT_CLINIC_OR_DEPARTMENT_OTHER): Payer: Self-pay

## 2016-10-24 LAB — MRSA SURVEILLANCE CULTURE

## 2016-10-26 LAB — TEG KAOLIN WITHOUT HEPARINASE

## 2016-10-30 LAB — CONFIRM OPIATES, URINE
Conf. 6 Acetylmorphine: NEGATIVE ng/mL (ref 0–9)
Conf. Codeine: NEGATIVE ng/mL (ref 0–99)
Conf. EDDP: NEGATIVE ng/mL (ref 0–99)
Conf. Fentanyl: NEGATIVE ng/mL (ref 0–4)
Conf. Hydrocodone: NEGATIVE ng/mL (ref 0–99)
Conf. Hydromorphone: POSITIVE ng/mL — AB (ref 0–99)
Conf. Methadone: NEGATIVE ng/mL (ref 0–99)
Conf. Morphine: NEGATIVE ng/mL (ref 0–99)
Conf. Norfentanyl: NEGATIVE ng/mL (ref 0–4)
Conf. Oxycodone: NEGATIVE ng/mL (ref 0–49)
Conf. Oxymorphone: NEGATIVE ng/mL (ref 0–49)

## 2016-11-13 NOTE — Discharge Summary (Signed)
Laurie [name] was admitted to [facility] on 10/15/2016.  The patient expired during the hospital stay.  (Please refer to the Death Note for the events on the day of death.)    Principal Diagnosis (required):  Traumatic brain injury    Hospital Problem List (required):  Active Hospital Problems    Diagnosis    Intraparenchymal hematoma of brain (CMS-HCC) [S06.360A]      Resolved Hospital Problems    Diagnosis   No resolved problems to display.       Additional Hospital Diagnoses ("rule out" or "suspected" diagnoses, etc.):     Metastatic hepatocellular carcinoma     Principal Procedure During This Hospitalization (required):  CT imaging of head, C-spine, L-spine, abdomen/pelvis  Endotracheal intubation     Other Procedures Performed During This Hospitalization (required):  None    Consultations Obtained During This Hospitalization:  Neurosurgery     Reason for Admission to the Hospital / Initial Presentation:  Adapted from admission H&P: 57 woman with metastatic hepatocellular carcinoma s/p ground level fall this morning. Patient is in hospice due to advanced [facility] with diffuse mets. After the fall, patient was noted to be unresponsive, GCS 3. Patient is A&O x 4 at baseline.  On exam she was noted to have a fixed and dilated right pupil and GCS of 7 so she was intubated in the trauma bay. She was subsequently admitted to the ICU.     Hospital Course (required):  Patient was worked up in the [place] following ATLS protocol.  She was intubated for inability to protect airway.  Her labs were consistent with known liver failure and her INR was elevated at 2.2.  CT head was significant for large bilateral subdural hematomas, right temporal lobe intraparenchymal hemorrhage and uncal herniation with mass effect on the midbrain. Patient previously in hospice care and extensive family discussion was had regarding prognosis and goals of care.  Decision was made to proceed with compassionate extubation after arrival  of family members.  Patient expired quickly after extubation.      Discharge Condition (required):  Expired.    Autopsy Request:  Permission to obtain an autopsy N/A this is a medical examiner's case and autopsy will be performed by their staff.     Contact Information for Discharging Physician:  [facility] operator at [phone].

## 2016-11-13 NOTE — Interdisciplinary (Signed)
2315: All family at bedside and Meredith Pel, daughter, expressed that now they were ready for compassionate extubation and comfort care. MD notified and RT informed  2330: Compassionate extubation performed at this time w/ Jose, RCP, while family elected to be outside of room. Cheyne-Stokes respirations noted immediately s/p extubation that rapidly progressed to agonal breathing.  2331: Immediate family @ BS and were made aware that extended family should join with haste d/t impending demise.   2332: all Family @ BS, door closed and privacy ensured. At this time Alphonsa Overall, MD, notified of physiologic status.   2333: apnea and asystole present at this time. MD made aware. Family grieving, will return to pronounce.  2344: Dr. Alphonsa Overall @ BS. TOD now.

## 2016-11-13 DEATH — deceased

## 2023-07-05 IMAGING — MR COLUNAS LOMBAR
1 series · 9 of 9 positions shown · non-contrast
Comparison: none

[Series 1: localizador · U · 2 acquisitions, 9 frames shown]
[im 1/2]
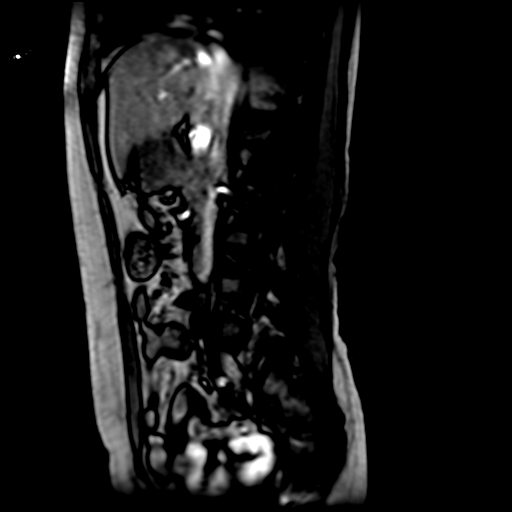
[im 1/2]
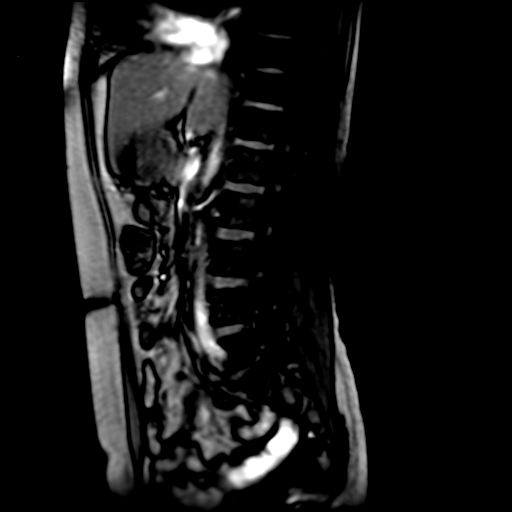
[im 1/2]
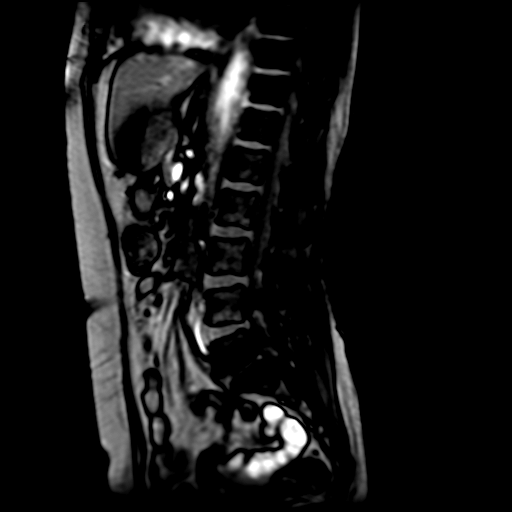
[im 1/2]
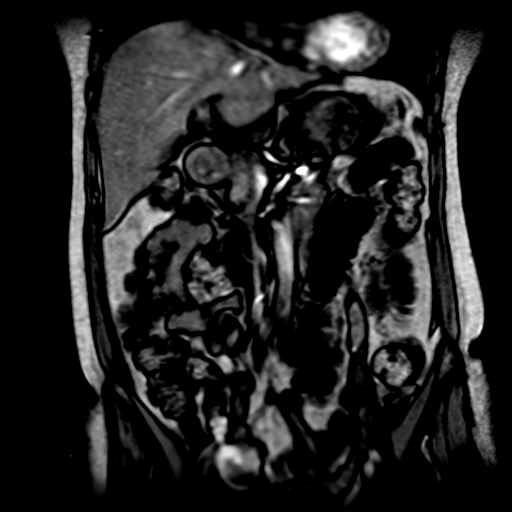
[im 1/2]
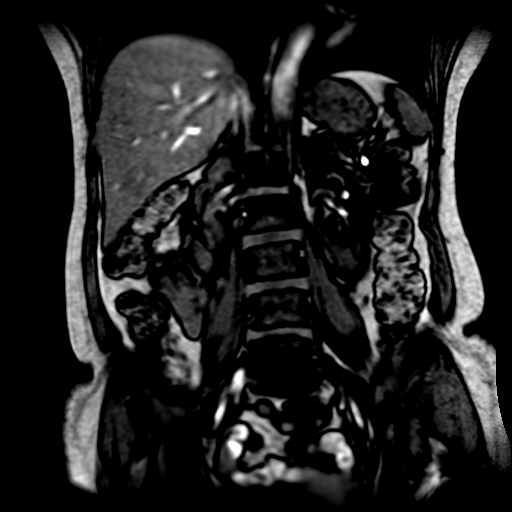
[im 1/2]
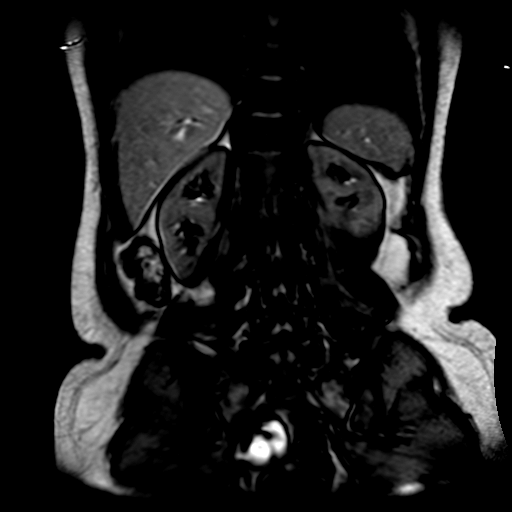
[im 1/2]
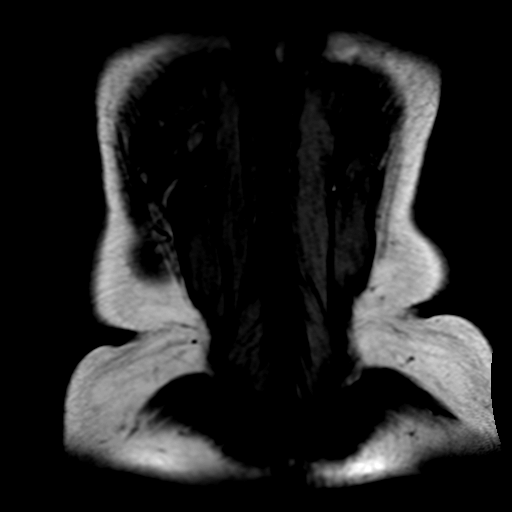
[im 2/2]
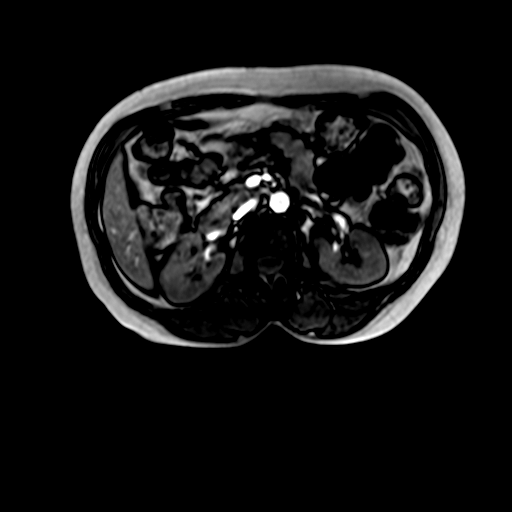
[im 2/2]
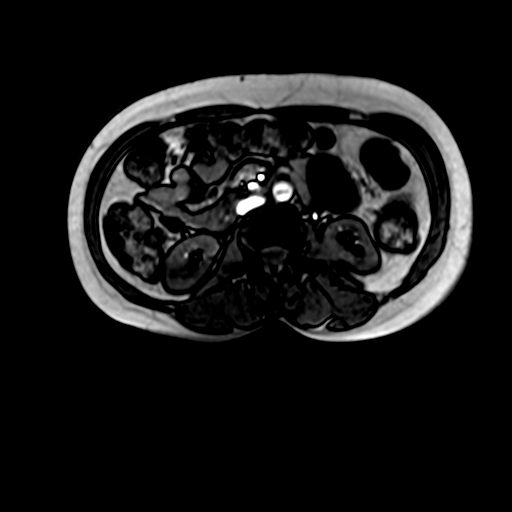

[9 of 9 positions shown; findings below may reference images not displayed]

Médico Solicitante:[facility]
Técnica:
Exame realizado com sequências ponderadas em T1 e T2, sem a administração intravenosa do agente de
contraste paramagnético.
Relatório:
O canal vertebral exibe dimensões normais por toda extensão avaliada.
RESSONÂNCIA MAGNÉTICA DA COLUNA LOMBOSSACRA
Os corpos vertebrais apresentam altura e alinhamento posterior preservados.
Osteófitos anteriores.
L1-L2 : desidratação discal. Hérnia discal posteromediana, migrando superiormente, sem contato com raizes
nervosas.
L2-L3 : Discreto abaulamento discal difuso, tocando levemente a face ventral do saco dural, sem contato
direto com as raízes nervosas. 
L3-L4: Discreto abaulamento discal difuso, tocando levemente a face ventral do saco dural, sem contato
direto com as raízes nervosas. 
L4-L5: desidratação discal. Abaulamento discal difuso, comprimindo a face ventral do saco dural e reduzindo
as porções ínfero-mediais dos neuroforames sem contato direto com as raízes nervosas.
L5-S1 : desidratação discal. Redução da altura do espaço discal. Abaulamento discal difuso comprimindo a
face ventral do saco dural, reduzindo a amplitude dos neuroforames e contactuando com as raízes nervosas
emergentes. Alteração discogênica do tipo Modic 2.Articulações interfacetarias sem alterações significativas.
Artrose das interfacetarias em todo o segmento lombar.
O cone medular é tópico, sendo de calibre e intensidade de sinal normais.
Impressão:
Espondilodiscopatia multissegmentar e artrose das interapofisárias.
Suriady Rabels

## 2024-02-11 IMAGING — MR JOELHO DIREITO
1 series · 9 of 9 positions shown · non-contrast
Comparison: none

[Series 2: localizer_sag+cor+tra · right · 2 acquisitions, 9 frames shown]
[im 1/2]
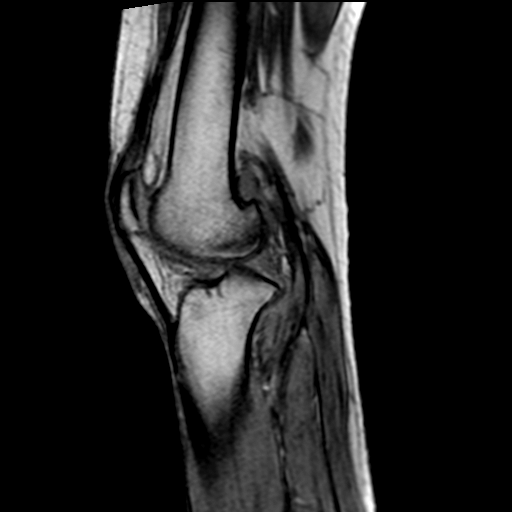
[im 1/2]
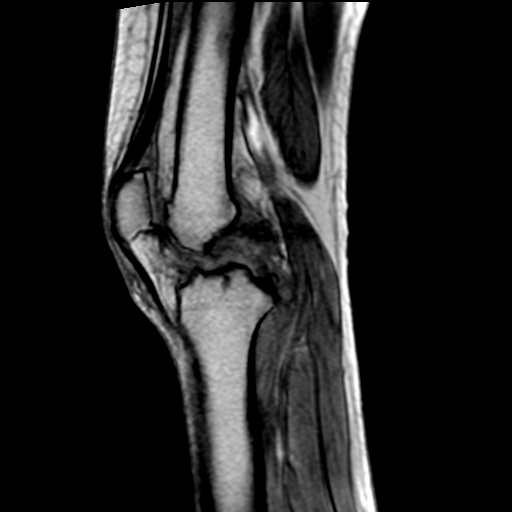
[im 1/2]
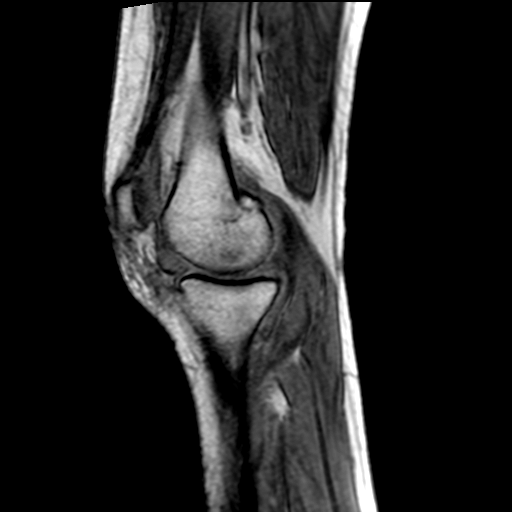
[im 1/2]
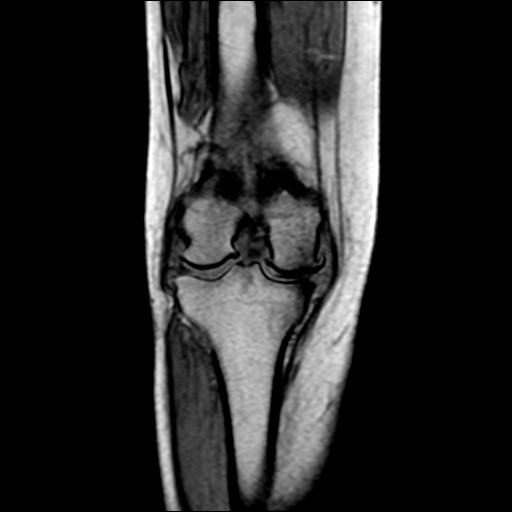
[im 1/2]
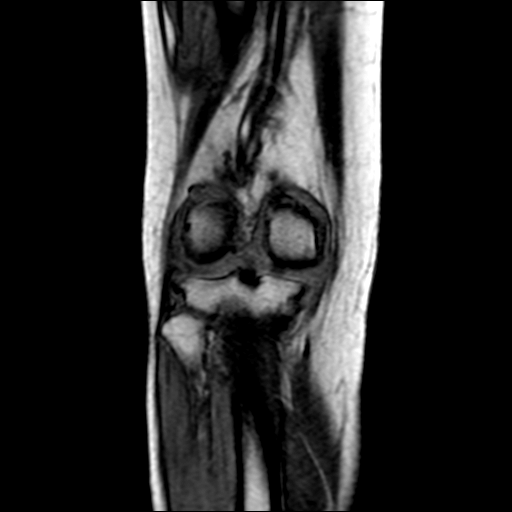
[im 1/2]
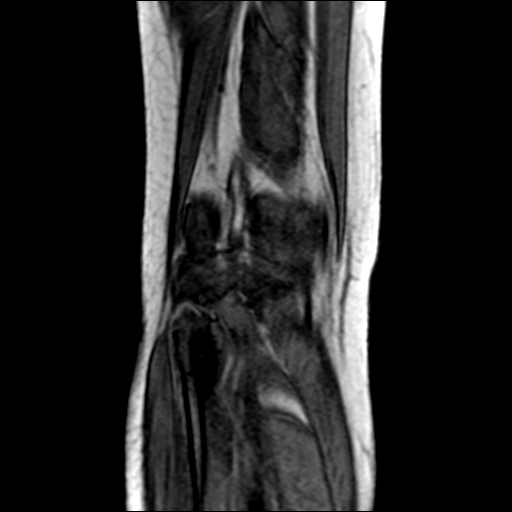
[im 2/2]
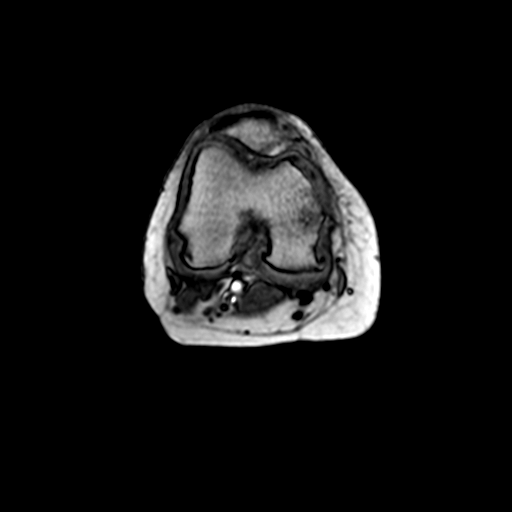
[im 2/2]
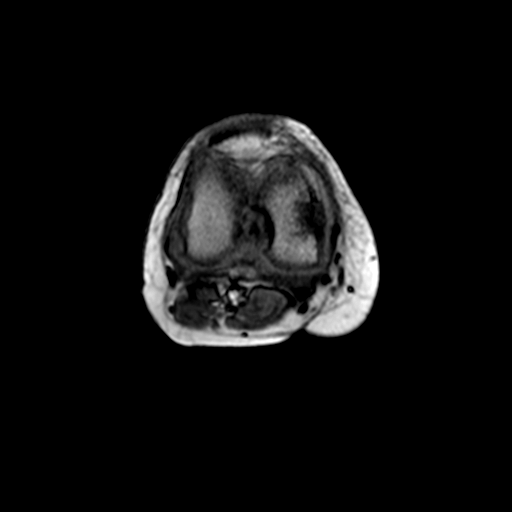
[im 2/2]
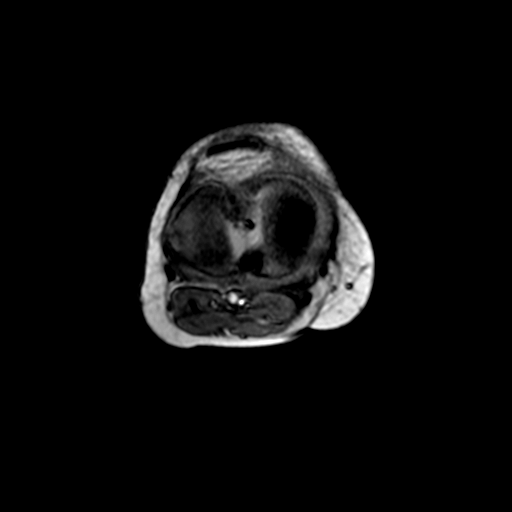

[9 of 9 positions shown; findings below may reference images not displayed]

[facility]
Método:
Exame realizado com sequências multiplanares ponderadas em T1 e DP com supressão de gordura.
Análise:
Alteração  degenerativa  difusa  do  menisco  medial,  destacando-se  rotura  radial  completa  da  raiz  meniscal  posterior,
com extrusão parcial do corpo em relação à interlinha articular.
Alteração degenerativa leve do corno posterior do menisco lateral, sem roturas. 
Ligamentos cruzados e colaterais preservados.  
RESSONÂNCIA MAGNÉTICA DO JOELHO DIREITO
Moderada artropatia degenerativa femorotibial medial, com osteóﬁtos marginais que se associam a aﬁlamento condral
irregular, acometendo até camadas profundas, mais evidentes na área de carga, com edema subcondral em ambos os
componentes.
Leve artropatia degenerativa femorotibial lateral, com osteóﬁtos marginais e aﬁlamento condral nas áreas de carga,
sem erosões profundas ou alterações subcondrais.
Moderada condropatia patelofemoral, com aﬁlamento condral irregular no vértice e facetas da patela, assim como no
sulco e facetas da tróclea, atingindo até as camadas profundas da cartilagem, com tênues focos de edema subcondral.
Associam-se pequenos osteóﬁtos marginais. 
Demais estruturas ósseas com morfologia e sinal preservados. 
Tendão do quadríceps e ligamento patelar de morfologia e sinal preservados.  
Ausência de derrame articular. 
Leve tendinopatia e peritendinite do gastrocnêmio medial junto à origem femoral, com alteração de sinal e edema de
permeio, sem roturas.
Demais planos musculares e tendíneos preservados. 
Discreto edema do subcutâneo na região anterior do joelho, sem coleções. 
[facility]
CONCLUSÃO:
-Rotura  radial  completa  da  raiz  meniscal  posterior,  com  extrusão  parcial  do  corpo  em  relação  à  interlinha
articular e alteração degenerativa do remanescente meniscal.
-Alteração degenerativa leve do corno posterior do menisco lateral.
-Moderada artropatia degenerativa femorotibial medial.
-Leve artropatia degenerativa femorotibial lateral.
-Moderada condropatia patelofemoral.
-Leve tendinopatia e peritendinite do gastrocnêmio medial junto à origem femoral.

## 2024-02-11 IMAGING — MR JOELHO ESQUERDO
1 series · 9 of 9 positions shown · non-contrast
Comparison: none

[Series 2: localizer_sag+cor+tra · left · 2 acquisitions, 9 frames shown]
[im 1/2]
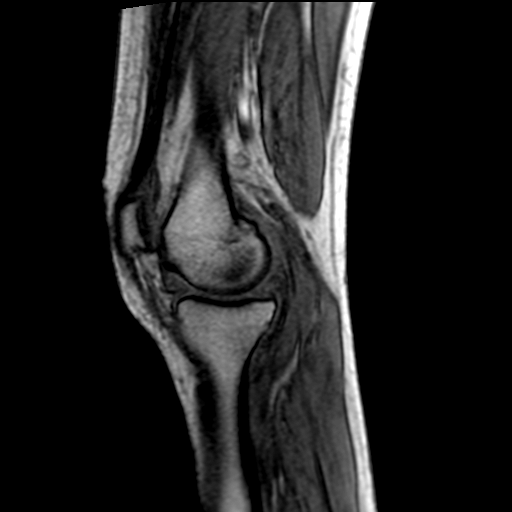
[im 1/2]
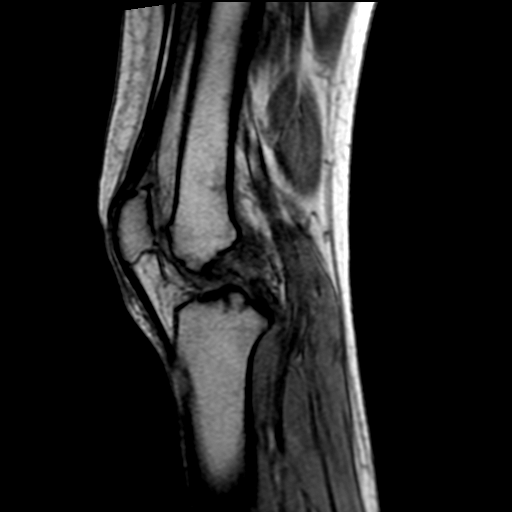
[im 1/2]
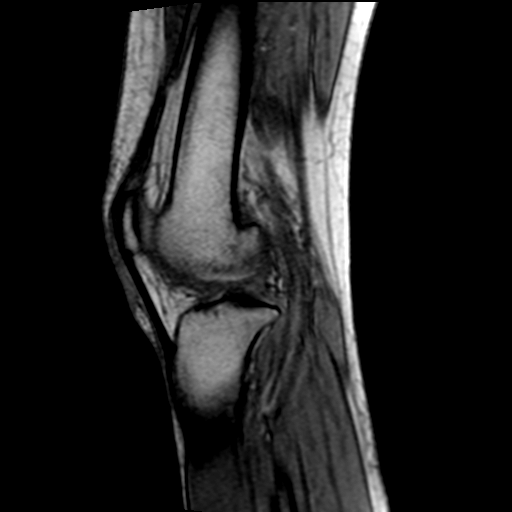
[im 1/2]
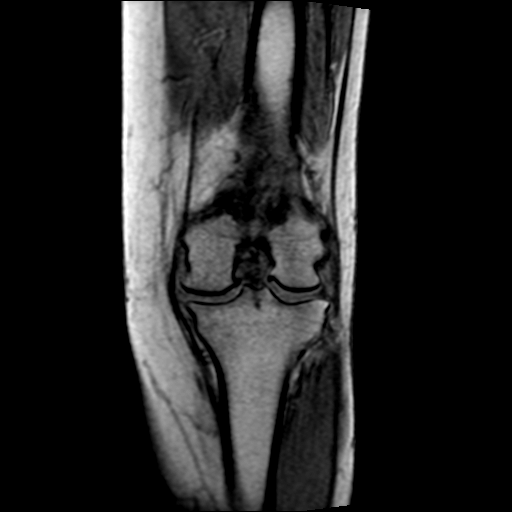
[im 1/2]
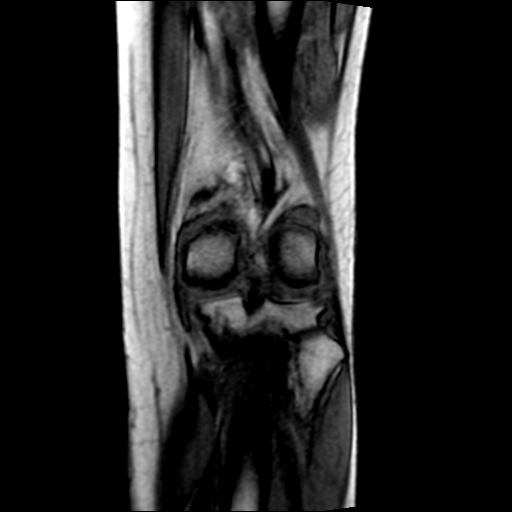
[im 1/2]
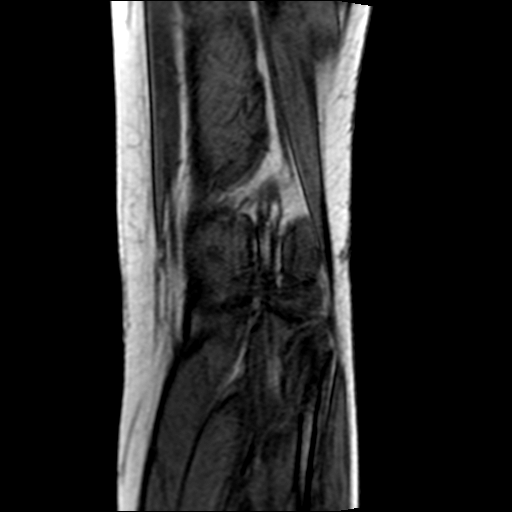
[im 2/2]
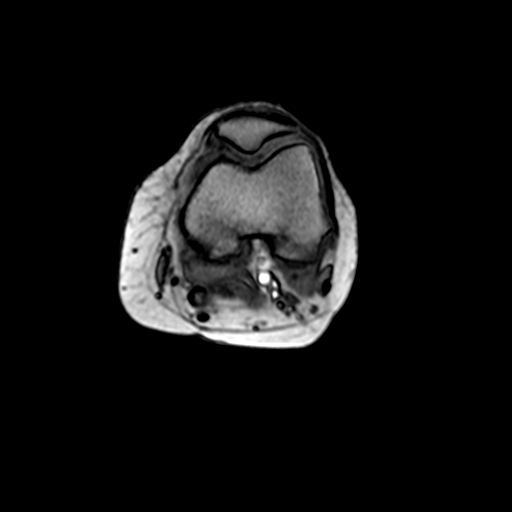
[im 2/2]
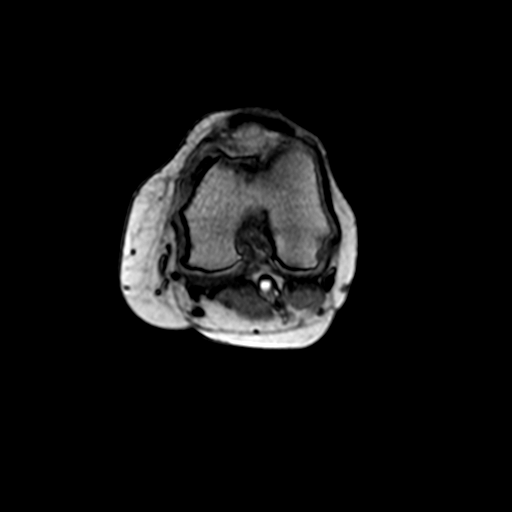
[im 2/2]
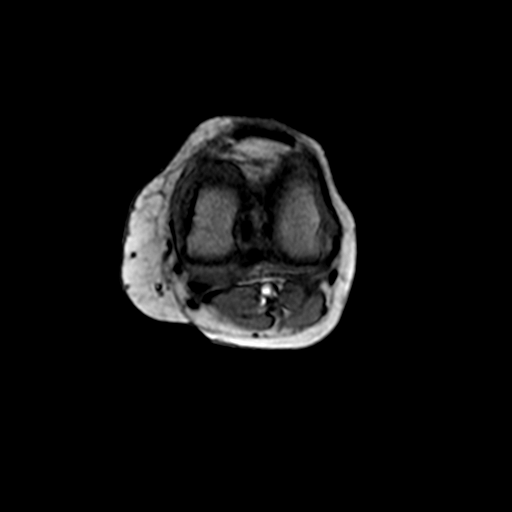

[9 of 9 positions shown; findings below may reference images not displayed]

[facility]
Método:
Exame realizado com sequências multiplanares ponderadas em T1 e DP com supressão de gordura.
Análise:
Alteração degenerativa do corno posterior do menisco medial, com aﬁlamento das margens livres e discreta extrusão
do corpo meniscal, sem roturas. 
Menisco lateral de morfologia e sinal preservados.
Ligamentos cruzados e colaterais preservados.  
RESSONÂNCIA MAGNÉTICA DO JOELHO ESQUERDO
Leve  artropatia  degenerativa  femorotibial  bicompartimental,  predominando  no  compartimento  medial,  com
aﬁlamento  condral  nas  áreas  de  carga,  sem  erosões  profundas  ou  alterações  subcondrais.  Associam-se  pequenos
osteóﬁtos marginais.
Moderada condropatia patelofemoral, com aﬁlamento condral irregular no vértice e facetas da patela, assim como no
sulco e facetas da tróclea, atingindo até as camadas profundas da cartilagem, com tênues focos de edema subcondral
na tróclea. Associam-se pequenos osteóﬁtos marginais. 
Demais estruturas ósseas com morfologia e sinal preservados. 
Tendão do quadríceps e ligamento patelar de morfologia e sinal preservados.  
Ausência de derrame articular. 
Pequeno cisto de Baker, com sinais de rotura e extravasamento de líquido nos planos mioadiposos circunjacentes. 
Edema da gordura infrapatelar lateral, sugestivo de sobrecarga do mecanismo extensor. 
Demais planos musculares e tendíneos preservados. 
Discreto edema do subcutâneo na região anterior do joelho, sem coleções. 
CONCLUSÃO:
[facility]
-Alteração degenerativa do corno posterior do menisco medial, com leve extrusão do corpo meniscal.
-Leve artropatia degenerativa femorotibial bicompartimental, predominando no compartimento medial.
-Moderada condropatia patelofemoral.
-Pequeno  cisto  de  Baker,  com  sinais  de  rotura  e  extravasamento  de  líquido  nos  planos  mioadiposos
circunjacentes. 
-Sinais de sobrecarga do mecanismo extensor.
